# Patient Record
Sex: Female | Born: 1980
Health system: Southern US, Community
[De-identification: ages and names within clinical notes are randomized; demographics above are authoritative.]

## PROBLEM LIST (undated history)

## (undated) DIAGNOSIS — Z808 Family history of malignant neoplasm of other organs or systems: Secondary | ICD-10-CM

## (undated) DIAGNOSIS — B002 Herpesviral gingivostomatitis and pharyngotonsillitis: Secondary | ICD-10-CM

## (undated) DIAGNOSIS — G4730 Sleep apnea, unspecified: Secondary | ICD-10-CM

## (undated) DIAGNOSIS — N200 Calculus of kidney: Secondary | ICD-10-CM

## (undated) DIAGNOSIS — G43909 Migraine, unspecified, not intractable, without status migrainosus: Secondary | ICD-10-CM

## (undated) DIAGNOSIS — F419 Anxiety disorder, unspecified: Secondary | ICD-10-CM

## (undated) DIAGNOSIS — Z803 Family history of malignant neoplasm of breast: Secondary | ICD-10-CM

## (undated) DIAGNOSIS — N12 Tubulo-interstitial nephritis, not specified as acute or chronic: Secondary | ICD-10-CM

## (undated) HISTORY — DX: Calculus of kidney: N20.0

## (undated) HISTORY — DX: Migraine, unspecified, not intractable, without status migrainosus: G43.909

## (undated) HISTORY — PX: NO PAST SURGERIES: SHX2092

## (undated) HISTORY — DX: Family history of malignant neoplasm of breast: Z80.3

## (undated) HISTORY — DX: Herpesviral gingivostomatitis and pharyngotonsillitis: B00.2

## (undated) HISTORY — DX: Sleep apnea, unspecified: G47.30

## (undated) HISTORY — DX: Anxiety disorder, unspecified: F41.9

## (undated) HISTORY — DX: Family history of malignant neoplasm of other organs or systems: Z80.8

## (undated) HISTORY — DX: Tubulo-interstitial nephritis, not specified as acute or chronic: N12

---

## 2010-04-07 ENCOUNTER — Ambulatory Visit: Payer: Self-pay | Admitting: Family Medicine

## 2014-08-01 ENCOUNTER — Other Ambulatory Visit: Payer: Self-pay

## 2014-08-01 DIAGNOSIS — Z3040 Encounter for surveillance of contraceptives, unspecified: Secondary | ICD-10-CM

## 2014-08-01 NOTE — Telephone Encounter (Signed)
Please call patient I am going to deny her request for the oral contraceptive pills She is getting ready to turn 34 years old in a few weeks Let her know that I'll recommend condoms for now and will be glad to refer her to see a gynecologist for discussion of other contraceptive means that they can manage I hope she realizes I am just looking out for her, but I will not recommend continued use of her current pill (if she has left-over packs, I recommend she STOP those because of the risk of stroke) Thank you, Dr. Sherie DonLada

## 2014-08-02 NOTE — Telephone Encounter (Signed)
Left message to call.

## 2014-08-02 NOTE — Telephone Encounter (Signed)
I tried to call patient, but number listed is wrong I called mobile and reached identified voicemail with name but work; left vague message then that I think we did address her issue this spring, sorry if she did not remember that or think it was addressed (aside: that's why I only gave limited refills of OCPs instead of a whole year) I understand she has another plan in place I am only trying to look out for her

## 2014-08-02 NOTE — Telephone Encounter (Signed)
I notified patient. She was a little upset that she got no "notice" that she would be stopped with no plan in place for a new method of birth control other than condoms. She is going to call herself and schedule [facility]. She states she is going to be out of town a lot this month and it's going to be hard to get in to see GYN before she runs out of her BCP. She asked if you would be willing to give her one more pack to cover her until she can see GYN. I told her probably no, but that i'd double check with you.

## 2014-08-30 ENCOUNTER — Encounter: Payer: Self-pay | Admitting: Obstetrics and Gynecology

## 2014-09-21 ENCOUNTER — Other Ambulatory Visit: Payer: Self-pay

## 2014-09-21 NOTE — Telephone Encounter (Signed)
Routing to provider  

## 2014-09-22 MED ORDER — NABUMETONE 750 MG PO TABS: 750.0000 mg | ORAL_TABLET | Freq: Two times a day (BID) | ORAL | Status: DC | PRN

## 2015-02-12 ENCOUNTER — Other Ambulatory Visit: Payer: Self-pay | Admitting: Family Medicine

## 2015-02-12 NOTE — Telephone Encounter (Signed)
rx approved; changed to PRN instead of routine as it came across on the refill request

## 2015-08-20 ENCOUNTER — Encounter: Payer: Self-pay | Admitting: Family Medicine

## 2015-08-20 ENCOUNTER — Ambulatory Visit (INDEPENDENT_AMBULATORY_CARE_PROVIDER_SITE_OTHER): Payer: 59 | Admitting: Family Medicine

## 2015-08-20 VITALS — BP 125/79 | HR 86 | Temp 98.5°F | Wt 155.0 lb

## 2015-08-20 DIAGNOSIS — N12 Tubulo-interstitial nephritis, not specified as acute or chronic: Secondary | ICD-10-CM

## 2015-08-20 DIAGNOSIS — N760 Acute vaginitis: Secondary | ICD-10-CM

## 2015-08-20 DIAGNOSIS — A499 Bacterial infection, unspecified: Secondary | ICD-10-CM | POA: Diagnosis not present

## 2015-08-20 DIAGNOSIS — B9689 Other specified bacterial agents as the cause of diseases classified elsewhere: Secondary | ICD-10-CM

## 2015-08-20 MED ORDER — CIPROFLOXACIN HCL 500 MG PO TABS: 500.0000 mg | ORAL_TABLET | Freq: Two times a day (BID) | ORAL | 0 refills | Status: DC

## 2015-08-20 MED ORDER — METRONIDAZOLE 500 MG PO TABS: 500.0000 mg | ORAL_TABLET | Freq: Three times a day (TID) | ORAL | 0 refills | Status: DC

## 2015-08-20 NOTE — Progress Notes (Signed)
BP 125/79   Pulse 86   Temp 98.5 F (36.9 C)   Wt 155 lb (70.3 kg)   LMP  (LMP Unknown) Comment: Patient has an IUD  SpO2 98%   BMI 26.94 kg/m    Subjective:    Patient ID: Molly Lynch, female    DOB: 1980/08/10, 35 y.o.   MRN: [id number]  HPI: Molly Lynch is a 35 y.o. female  Chief Complaint  Patient presents with  . Urinary Tract Infection    started about 2 weeks ago as a yeast infection, did OTC meds twice.That improved but still having discharge. Starting Friday she started having the frequency, urgency, burning, low back pain, and lower pain   Patient presents with 2 week history of vaginal itching and discharge that seemed to improve with 2 rounds of OTC medications for yeast. She is now having 3 days of urinary frequency, urgency, dysuria, and lbp. Discharge is also recurring the past 2 days.Taking AZO with good symptomatic relief. Possibly had a fever on Saturday, but otherwise hasn't felt fever/chills. N/V one time yesterday. Back pain started today on left side, achy moderate pain.   Relevant past medical, surgical, family and social history reviewed and updated as indicated. Interim medical history since our last visit reviewed. Allergies and medications reviewed and updated.  Review of Systems  Constitutional: Negative.   HENT: Negative.   Respiratory: Negative.   Cardiovascular: Negative.   Gastrointestinal: Positive for nausea and vomiting.  Genitourinary: Positive for dysuria, flank pain, frequency, urgency and vaginal discharge.  Musculoskeletal: Positive for back pain.  Neurological: Negative.   Psychiatric/Behavioral: Negative.     Per HPI unless specifically indicated above     Objective:    BP 125/79   Pulse 86   Temp 98.5 F (36.9 C)   Wt 155 lb (70.3 kg)   LMP  (LMP Unknown) Comment: Patient has an IUD  SpO2 98%   BMI 26.94 kg/m   Wt Readings from Last 3 Encounters:  08/20/15 155 lb (70.3 kg)  04/03/14 158 lb (71.7 kg)      Physical Exam  Constitutional: She is oriented to person, place, and time. She appears well-developed and well-nourished. No distress.  HENT:  Head: Atraumatic.  Eyes: Conjunctivae are normal. No scleral icterus.  Neck: Normal range of motion. Neck supple.  Cardiovascular: Normal rate.   Pulmonary/Chest: Effort normal and breath sounds normal.  Abdominal: Soft. Bowel sounds are normal. She exhibits no distension. There is tenderness (LLQ tender to deep palpation). There is no guarding.  Genitourinary: Vaginal discharge (thin white discharge present ) found.  Musculoskeletal: Normal range of motion. She exhibits tenderness (CVA tenderness on left).  Neurological: She is alert and oriented to person, place, and time.  Skin: Skin is warm and dry.  Psychiatric: She has a normal mood and affect. Her behavior is normal.  Nursing note and vitals reviewed.   No results found for this or any previous visit.    Assessment & Plan:   Problem List Items Addressed This Visit    None    Visit Diagnoses    Pyelonephritis    -  Primary   Relevant Orders   UA/M w/rflx Culture, Routine (STAT)   BV (bacterial vaginosis)       Relevant Medications   metroNIDAZOLE (FLAGYL) 500 MG tablet   Other Relevant Orders   WET PREP FOR TRICH, YEAST, CLUE     Likely early stage pyelonephritis given presence of CVA tenderness and  intermittent N/V. Discussed with pt that if she develops high fever, intractable N/V, or severe worsening left flank pain to go to ER. Will treat with ciprofloxacin for 7 days. Await urine cx.   Flagyl sent for BV shown in wet prep.     Follow up plan: No Follow-up on file.

## 2015-08-20 NOTE — Patient Instructions (Signed)
Follow up if no improvement in the next few days 

## 2015-08-24 LAB — MICROSCOPIC EXAMINATION

## 2015-08-24 LAB — UA/M W/RFLX CULTURE, ROUTINE
Bilirubin, UA: NEGATIVE
GLUCOSE, UA: NEGATIVE
KETONES UA: NEGATIVE
Nitrite, UA: POSITIVE — AB
UUROB: 0.2 mg/dL (ref 0.2–1.0)
pH, UA: 5 (ref 5.0–7.5)

## 2015-08-24 LAB — URINE CULTURE, REFLEX

## 2015-08-24 LAB — WET PREP FOR TRICH, YEAST, CLUE
Clue Cell Exam: POSITIVE — AB
Trichomonas Exam: NEGATIVE
YEAST EXAM: NEGATIVE

## 2015-10-04 ENCOUNTER — Encounter: Payer: Self-pay | Admitting: Family Medicine

## 2015-10-30 ENCOUNTER — Encounter: Payer: Self-pay | Admitting: Family Medicine

## 2015-11-02 ENCOUNTER — Telehealth: Payer: Self-pay | Admitting: Family Medicine

## 2015-11-02 ENCOUNTER — Other Ambulatory Visit: Payer: Self-pay | Admitting: Family Medicine

## 2015-11-02 ENCOUNTER — Other Ambulatory Visit: Payer: Self-pay

## 2015-11-02 ENCOUNTER — Ambulatory Visit
Admission: RE | Admit: 2015-11-02 | Discharge: 2015-11-02 | Disposition: A | Discharge status: Home / Self Care | Payer: 59 | Source: Ambulatory Visit | Attending: Family Medicine | Admitting: Family Medicine

## 2015-11-02 ENCOUNTER — Encounter: Payer: Self-pay | Admitting: Family Medicine

## 2015-11-02 ENCOUNTER — Ambulatory Visit (INDEPENDENT_AMBULATORY_CARE_PROVIDER_SITE_OTHER): Payer: 59 | Admitting: Family Medicine

## 2015-11-02 VITALS — BP 111/75 | HR 69 | Temp 98.5°F | Wt 155.0 lb

## 2015-11-02 DIAGNOSIS — F17209 Nicotine dependence, unspecified, with unspecified nicotine-induced disorders: Secondary | ICD-10-CM

## 2015-11-02 DIAGNOSIS — S6991XA Unspecified injury of right wrist, hand and finger(s), initial encounter: Secondary | ICD-10-CM | POA: Insufficient documentation

## 2015-11-02 DIAGNOSIS — X58XXXA Exposure to other specified factors, initial encounter: Secondary | ICD-10-CM | POA: Diagnosis not present

## 2015-11-02 MED ORDER — BUPROPION HCL ER (XL) 150 MG PO TB24: 150.0000 mg | ORAL_TABLET | Freq: Every day | ORAL | 30 refills | Status: DC

## 2015-11-02 MED ORDER — PREDNISONE 20 MG PO TABS: 40.0000 mg | ORAL_TABLET | Freq: Every day | ORAL | 0 refills | Status: DC

## 2015-11-02 NOTE — Telephone Encounter (Signed)
Please call pt and let her know that her hand x-ray showed no evidence of fracture or dislocation. I will send her a burst of prednisone to help calm things down and she can take tylenol during that, then resume ibuprofen as needed once prednisone has completed. Rest hand as much as possible, can get a support brace if it's helpful. Ice/heat.

## 2015-11-02 NOTE — Telephone Encounter (Signed)
Left message to call.

## 2015-11-02 NOTE — Patient Instructions (Addendum)
Follow up in about a month to discuss wellbutrin

## 2015-11-02 NOTE — Telephone Encounter (Signed)
Patient was notified of results via MyChart message.

## 2015-11-02 NOTE — Progress Notes (Signed)
BP 111/75   Pulse 69   Temp 98.5 F (36.9 C)   Wt 155 lb (70.3 kg)   LMP  (LMP Unknown)   SpO2 100%   BMI 26.94 kg/m    Subjective:    Patient ID: Molly Lynch, female    DOB: 1980-03-22, 35 y.o.   MRN: [id number]  HPI: Molly Lynch is a 35 y.o. female  Chief Complaint  Patient presents with  . Hand Pain    right hand x 2 months. Was running down hall and hit it on a door frame. Some motions make it hurt worse. Can't open a jar or a bottle without sharp pain.  . Nicotine Dependence    she does smoke socially. has taken Wellbutrin in the past and it worked well to help her stop. Would like to try that again/   Right hand injury mid-July. States she smacked her hand very hard on a door knob directly over her middle MCP. Had bruising, swelling, and severe pain for over a week constantly, slowly lessening over time. Now describes a dull intermittent ache in that area that becomes acutely sharp with some motions or use of grip strength. Certain movements like opening jars and water bottles. Taking ibuprofen with some relief of aching pain.  Socially smokes and wanting to quit. Took wellbutrin for about 6 weeks last year and was able to quit smoking with it. Feels like she stopped too soon though and eventually started back up. Did very well on the medicine previously. Had some difficulty sleeping that dissipated once she changed the time of day that she was taking the medication, but otherwise had no issues.    Past Medical History:  Diagnosis Date  . Anxiety   . Kidney stones   . Migraine    Social History   Social History  . Marital status: Married    Spouse name: N/A  . Number of children: N/A  . Years of education: N/A   Occupational History  . Not on file.   Social History Main Topics  . Smoking status: Current Some Day Smoker  . Smokeless tobacco: Never Used  . Alcohol use Yes     Comment: socially  . Drug use: No  . Sexual activity: Not on file    Other Topics Concern  . Not on file   Social History Narrative  . No narrative on file    Relevant past medical, surgical, family and social history reviewed and updated as indicated. Interim medical history since our last visit reviewed. Allergies and medications reviewed and updated.  Review of Systems  Constitutional: Negative.   HENT: Negative.   Respiratory: Negative.   Cardiovascular: Negative.   Gastrointestinal: Negative.   Genitourinary: Negative.   Musculoskeletal: Positive for arthralgias.  Skin: Negative.   Neurological: Negative.   Psychiatric/Behavioral: Negative.     Per HPI unless specifically indicated above     Objective:    BP 111/75   Pulse 69   Temp 98.5 F (36.9 C)   Wt 155 lb (70.3 kg)   LMP  (LMP Unknown)   SpO2 100%   BMI 26.94 kg/m   Wt Readings from Last 3 Encounters:  11/02/15 155 lb (70.3 kg)  08/20/15 155 lb (70.3 kg)  04/03/14 158 lb (71.7 kg)    Physical Exam  Constitutional: She is oriented to person, place, and time. She appears well-developed and well-nourished. No distress.  HENT:  Head: Atraumatic.  Eyes: Conjunctivae are normal. No  scleral icterus.  Neck: Normal range of motion. Neck supple.  Cardiovascular: Normal rate, regular rhythm and normal heart sounds.   Pulmonary/Chest: Effort normal. No respiratory distress.  Musculoskeletal: Normal range of motion.  Moderately point tender over 3rd MCP, some mild edema in this area as well. Strength intact, but pain with grip  Neurological: She is alert and oriented to person, place, and time.  Sensation intact b/l UEs  Skin: Skin is warm and dry.  Psychiatric: She has a normal mood and affect. Her behavior is normal.  Nursing note and vitals reviewed.     Assessment & Plan:   Problem List Items Addressed This Visit    None    Visit Diagnoses    Hand injury, right, initial encounter    -  Primary   Await x-ray, will set follow up depending on the results. Continue  ibuprofen and rest as needed in the meantime.   Relevant Orders   DG Hand Complete Right (Completed)   Tobacco use disorder, continuous       Will try Wellbutrin again as she's had success in the past. Start with 150 mg QD, will discuss increasing in about 2 weeks. Encouraged her to set quit date.        Follow up plan: Return in about 4 weeks (around 11/30/2015) for Medication management.

## 2016-05-17 ENCOUNTER — Encounter: Payer: Self-pay | Admitting: Family Medicine

## 2016-05-19 ENCOUNTER — Other Ambulatory Visit: Payer: Self-pay | Admitting: Family Medicine

## 2016-05-19 MED ORDER — BUPROPION HCL ER (XL) 300 MG PO TB24: 300.0000 mg | ORAL_TABLET | Freq: Every day | ORAL | 6 refills | Status: DC

## 2016-07-21 ENCOUNTER — Ambulatory Visit (INDEPENDENT_AMBULATORY_CARE_PROVIDER_SITE_OTHER): Payer: 59 | Admitting: Family Medicine

## 2016-07-21 ENCOUNTER — Encounter: Payer: Self-pay | Admitting: Family Medicine

## 2016-07-21 VITALS — BP 140/91 | HR 73 | Temp 99.0°F | Ht 63.3 in | Wt 167.0 lb

## 2016-07-21 DIAGNOSIS — T23232A Burn of second degree of multiple left fingers (nail), not including thumb, initial encounter: Secondary | ICD-10-CM | POA: Diagnosis not present

## 2016-07-21 DIAGNOSIS — T23202A Burn of second degree of left hand, unspecified site, initial encounter: Secondary | ICD-10-CM | POA: Diagnosis not present

## 2016-07-21 DIAGNOSIS — Z23 Encounter for immunization: Secondary | ICD-10-CM | POA: Diagnosis not present

## 2016-07-21 MED ORDER — SILVER SULFADIAZINE 1 % EX CREA: 1.0000 "application " | TOPICAL_CREAM | Freq: Two times a day (BID) | CUTANEOUS | 1 refills | Status: DC

## 2016-07-21 NOTE — Patient Instructions (Addendum)
Burn Care, Adult  A burn is an injury to the skin or the tissues under the skin. There are three types of burns:  · First degree. These burns may cause the skin to be red and slightly swollen.  · Second degree. These burns are very painful and cause the skin to be very red. The skin may also leak fluid, look shiny, and develop blisters.  · Third degree. These burns cause permanent damage. They either turn the skin white or black and make it look charred, dry, and leathery.    Taking care of your burn properly can help to prevent pain and infection. It can also help the burn to heal more quickly.  What are the risks?  Complications from burns include:  · Damage to the skin.  · Reduced blood flow near the injury.  · Dead tissue.  · Scarring.  · Problems with movement, if the burn happened near a joint or on the hands or feet.    Severe burns can lead to problems that affect the whole body, such as:  · Fluid loss.  · Less blood circulating in the body.  · Inability to maintain a normal core body temperature (thermoregulation).  · Infection.  · Shock.  · Problems breathing.    How to care for a first-degree burn  Right after a burn:  · Rinse or soak the burn under cool water until the pain stops. Do not put ice on your burn. This can cause more damage.  · Lightly cover the burn with a sterile cloth (dressing).  Burn care  · Follow instructions from your health care provider about:  ? How to clean and take care of the burn.  ? When to change and remove the dressing.  · Check your burn every day for signs of infection. Check for:  ? More redness, swelling, or pain.  ? Warmth.  ? Pus or a bad smell.  Medicine  · Take over-the-counter and prescription medicines only as told by your health care provider.  · If you were prescribed antibiotic medicine, take or apply it as told by your health care provider. Do not stop using the antibiotic even if your condition improves.  General instructions  · To prevent infection, do not  put butter, oil, or other home remedies on your burn.  · Do not rub your burn, even when you are cleaning it.  · Protect your burn from the sun.  How to care for a second-degree burn  Right after a burn:  · Rinse or soak the burn under cool water. Do this for several minutes. Do not put ice on your burn. This can cause more damage.  · Lightly cover the burn with a sterile cloth (dressing).  Burn care  · Raise (elevate) the injured area above the level of your heart while sitting or lying down.  · Follow instructions from your health care provider about:  ? How to clean and take care of the burn.  ? When to change and remove the dressing.  · Check your burn every day for signs of infection. Check for:  ? More redness, swelling, or pain.  ? Warmth.  ? Pus or a bad smell.  Medicine    · Take over-the-counter and prescription medicines only as told by your health care provider.  · If you were prescribed antibiotic medicine, take or apply it as told by your health care provider. Do not stop using the antibiotic even if your   the sun. How to care for a third-degree burn Right after a burn:  Lightly cover the burn with gauze.  Seek immediate medical attention. Burn care  Raise (elevate) the injured area above the level of your heart while sitting or lying down.  Drink enough fluid to keep your urine clear or pale yellow.  Rest as told by your health care provider. Do not participate in sports or other physical activities until your health care provider approves.  Follow instructions from your health care provider about: ? How to clean and take care of the burn. ? When to change and remove the dressing.  Check  your burn every day for signs of infection. Check for: ? More redness, swelling, or pain. ? Warmth. ? Pus or a bad smell. Medicine  Take over-the-counter and prescription medicines only as told by your health care provider.  If you were prescribed antibiotic medicine, take or apply it as told by your health care provider. Do not stop using the antibiotic even if your condition improves. General instructions  To prevent infection: ? Do not put butter, oil, or other home remedies on the burn. ? Do not scratch or pick at the burn. ? Do not break any blisters. ? Do not peel skin. ? Do not rub your burn, even when you are cleaning it.  Protect your burn from the sun.  Keep all follow-up visits as told by your health care provider. This is important. Contact a health care provider if:  Your condition does not improve.  Your condition gets worse.  You have a fever.  Your burn changes in appearance or develops black or red spots.  Your burn feels warm to the touch.  Your pain is not controlled with medicine. Get help right away if:  You have redness, swelling, or pain at the site of the burn.  You have fluid, blood, or pus coming from your burn.  You have red streaks near the burn.  You have severe pain. This information is not intended to replace advice given to you by your health care provider. Make sure you discuss any questions you have with your health care provider. Document Released: 01/13/2005 Document Revised: 08/05/2015 Document Reviewed: 07/03/2015 Elsevier Interactive Patient Education  2018 [employer]. Tdap Vaccine (Tetanus, Diphtheria and Pertussis): What You Need to Know 1. Why get vaccinated? Tetanus, diphtheria and pertussis are very serious diseases. Tdap vaccine can protect [place] from these diseases. And, Tdap vaccine given to pregnant women can protect newborn babies against pertussis. TETANUS (Lockjaw) is rare in the [place] today. It causes painful  muscle tightening and stiffness, usually all over the body.  It can lead to tightening of muscles in the head and neck so you can't open your mouth, swallow, or sometimes even breathe. Tetanus kills about 1 out of 10 people who are infected even after receiving the best medical care.  DIPHTHERIA is also rare in the [place] today. It can cause a thick coating to form in the back of the throat.  It can lead to breathing problems, heart failure, paralysis, and death.  PERTUSSIS (Whooping Cough) causes severe coughing spells, which can cause difficulty breathing, vomiting and disturbed sleep.  It can also lead to weight loss, incontinence, and rib fractures. Up to 2 in 100 adolescents and 5 in 100 adults with pertussis are hospitalized or have complications, which could include pneumonia or death.  These diseases are caused by bacteria. Diphtheria and pertussis are spread from person  to person through secretions from coughing or sneezing. Tetanus enters the body through cuts, scratches, or wounds. Before vaccines, as many as 200,000 cases of diphtheria, 200,000 cases of pertussis, and hundreds of cases of tetanus, were reported in the [place] each year. Since vaccination began, reports of cases for tetanus and diphtheria have dropped by about 99% and for pertussis by about 80%. 2. Tdap vaccine Tdap vaccine can protect adolescents and adults from tetanus, diphtheria, and pertussis. One dose of Tdap is routinely given at age 35 or 77. People who did not get Tdap at that age should get it as soon as possible. Tdap is especially important for healthcare professionals and anyone having close contact with a baby younger than 12 months. Pregnant women should get a dose of Tdap during every pregnancy, to protect the newborn from pertussis. Infants are most at risk for severe, life-threatening complications from pertussis. Another vaccine, called Td, protects against tetanus and diphtheria, but not  pertussis. A Td booster should be given every 10 years. Tdap may be given as one of these boosters if you have never gotten Tdap before. Tdap may also be given after a severe cut or burn to prevent tetanus infection. Your doctor or the person giving you the vaccine can give you more information. Tdap may safely be given at the same time as other vaccines. 3. Some people should not get this vaccine  A person who has ever had a life-threatening allergic reaction after a previous dose of any diphtheria, tetanus or pertussis containing vaccine, OR has a severe allergy to any part of this vaccine, should not get Tdap vaccine. Tell the person giving the vaccine about any severe allergies.  Anyone who had coma or long repeated seizures within 7 days after a childhood dose of DTP or DTaP, or a previous dose of Tdap, should not get Tdap, unless a cause other than the vaccine was found. They can still get Td.  Talk to your doctor if you: ? have seizures or another nervous system problem, ? had severe pain or swelling after any vaccine containing diphtheria, tetanus or pertussis, ? ever had a condition called Guillain-Barr Syndrome (GBS), ? aren't feeling well on the day the shot is scheduled. 4. Risks With any medicine, including vaccines, there is a chance of side effects. These are usually mild and go away on their own. Serious reactions are also possible but are rare. Most people who get Tdap vaccine do not have any problems with it. Mild problems following Tdap: (Did not interfere with activities)  Pain where the shot was given (about 3 in 4 adolescents or 2 in 3 adults)  Redness or swelling where the shot was given (about 1 person in 5)  Mild fever of at least 100.35F (up to about 1 in 25 adolescents or 1 in 100 adults)  Headache (about 3 or 4 people in 10)  Tiredness (about 1 person in 3 or 4)  Nausea, vomiting, diarrhea, stomach ache (up to 1 in 4 adolescents or 1 in 10 adults)  Chills,  sore joints (about 1 person in 10)  Body aches (about 1 person in 3 or 4)  Rash, swollen glands (uncommon)  Moderate problems following Tdap: (Interfered with activities, but did not require medical attention)  Pain where the shot was given (up to 1 in 5 or 6)  Redness or swelling where the shot was given (up to about 1 in 16 adolescents or 1 in 12 adults)  Fever over  102F (about 1 in 100 adolescents or 1 in 250 adults)  Headache (about 1 in 7 adolescents or 1 in 10 adults)  Nausea, vomiting, diarrhea, stomach ache (up to 1 or 3 people in 100)  Swelling of the entire arm where the shot was given (up to about 1 in 500).  Severe problems following Tdap: (Unable to perform usual activities; required medical attention)  Swelling, severe pain, bleeding and redness in the arm where the shot was given (rare).  Problems that could happen after any vaccine:  People sometimes faint after a medical procedure, including vaccination. Sitting or lying down for about 15 minutes can help prevent fainting, and injuries caused by a fall. Tell your doctor if you feel dizzy, or have vision changes or ringing in the ears.  Some people get severe pain in the shoulder and have difficulty moving the arm where a shot was given. This happens very rarely.  Any medication can cause a severe allergic reaction. Such reactions from a vaccine are very rare, estimated at fewer than 1 in a million doses, and would happen within a few minutes to a few hours after the vaccination. As with any medicine, there is a very remote chance of a vaccine causing a serious injury or death. The safety of vaccines is always being monitored. For more information, visit: http://floyd.org/www.cdc.gov/vaccinesafety/ 5. What if there is a serious problem? What should I look for? Look for anything that concerns you, such as signs of a severe allergic reaction, very high fever, or unusual behavior. Signs of a severe allergic reaction can include  hives, swelling of the face and throat, difficulty breathing, a fast heartbeat, dizziness, and weakness. These would usually start a few minutes to a few hours after the vaccination. What should I do?  If you think it is a severe allergic reaction or other emergency that can't wait, call 9-1-1 or get the person to the nearest hospital. Otherwise, call your doctor.  Afterward, the reaction should be reported to the Vaccine Adverse Event Reporting System (VAERS). Your doctor might file this report, or you can do it yourself through the VAERS web site at www.vaers.LAgents.nohhs.gov, or by calling 1-2021526811. ? VAERS does not give medical advice. 6. The National Vaccine Injury Compensation Program The [employer] Vaccine Injury Compensation Program (VICP) is a federal program that was created to compensate people who may have been injured by certain vaccines. Persons who believe they may have been injured by a vaccine can learn about the program and about filing a claim by calling 1-419-278-7288 or visiting the VICP website at SpiritualWord.atwww.hrsa.gov/vaccinecompensation. There is a time limit to file a claim for compensation. 7. How can I learn more?  Ask your doctor. He or she can give you the vaccine package insert or suggest other sources of information.  Call your local or state health department.  Contact the Centers for Disease Control and Prevention (CDC): ? Call (615) 343-69221-(236) 647-6055 (1-800-CDC-INFO) or ? Visit CDC's website at PicCapture.uywww.cdc.gov/vaccines CDC Tdap Vaccine VIS (03/22/13) This information is not intended to replace advice given to you by your health care provider. Make sure you discuss any questions you have with your health care provider. Document Released: 07/15/2011 Document Revised: 10/04/2015 Document Reviewed: 10/04/2015 Elsevier Interactive Patient Education  2017 [employer]
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
## 2016-08-15 ENCOUNTER — Ambulatory Visit: Payer: Self-pay | Admitting: Certified Nurse Midwife

## 2016-09-24 ENCOUNTER — Ambulatory Visit: Payer: Self-pay | Admitting: Certified Nurse Midwife

## 2016-10-24 ENCOUNTER — Ambulatory Visit: Payer: Self-pay | Admitting: Certified Nurse Midwife

## 2016-11-14 ENCOUNTER — Ambulatory Visit (INDEPENDENT_AMBULATORY_CARE_PROVIDER_SITE_OTHER): Payer: 59 | Admitting: Certified Nurse Midwife

## 2016-11-14 ENCOUNTER — Encounter: Payer: Self-pay | Admitting: Certified Nurse Midwife

## 2016-11-14 VITALS — BP 112/70 | HR 77 | Ht 63.0 in | Wt 162.0 lb

## 2016-11-14 DIAGNOSIS — Z803 Family history of malignant neoplasm of breast: Secondary | ICD-10-CM | POA: Insufficient documentation

## 2016-11-14 DIAGNOSIS — Z124 Encounter for screening for malignant neoplasm of cervix: Secondary | ICD-10-CM | POA: Diagnosis not present

## 2016-11-14 DIAGNOSIS — Z975 Presence of (intrauterine) contraceptive device: Secondary | ICD-10-CM

## 2016-11-14 DIAGNOSIS — Z30431 Encounter for routine checking of intrauterine contraceptive device: Secondary | ICD-10-CM | POA: Insufficient documentation

## 2016-11-14 DIAGNOSIS — Z01419 Encounter for gynecological examination (general) (routine) without abnormal findings: Secondary | ICD-10-CM | POA: Diagnosis not present

## 2016-11-14 NOTE — Progress Notes (Signed)
Gynecology Annual Exam  PCP: Gabriel CirriWicker, Cheryl, NP  Chief Complaint:  Chief Complaint  Patient presents with  . Gynecologic Exam    History of Present Illness:This is a 36 year old Caucasian/White female, G1 P1001 who presents for an annual gyn exam. She is having no significant gyn concerns. Her menses are absent on the Mirena IUD. Her migraines have all but vanished since stopping OCPs and having the Mirena inserted 09/22/2014. She does get some cyclical PMS sx.  Her last Pap smear was ?03/26/2012 and was RNIL. No hx of abnormal Pap smears.  The patient's past medical and social history are notable for smoking and common migraines and anxiety. She is currently using e-cigarettes, and hopes to quit that also. She was able to stop cigarette smoking in 2016 after starting Wellbutrin, but eventually resumed smoking when she stopped the Wellbutrin. Her PCP restarted the Wellbutrin last year to help her with smoking cessation.   Since her last visit, she has had pyelonephritis 2017   The patient does perform self breast exams. Her last mammogram was NA.  There is a family history of breast cancer in her maternal grandmother Genetic testing has not been done.   There is no family history of ovarian cancer.   The patient uses e-cigarettes  She reports drinking alcohol. She reports have 5-7 drinks per week.   She denies illegal drug use.  The patient reports exercising occasionally.  The patient denies current symptoms of depression.    Review of Systems: Review of Systems  Constitutional: Negative for chills, fever and weight loss.  HENT: Negative for congestion, sinus pain and sore throat.   Eyes: Negative for blurred vision and pain.  Respiratory: Negative for hemoptysis, shortness of breath and wheezing.   Cardiovascular: Negative for chest pain, palpitations and leg swelling.  Gastrointestinal: Negative for abdominal pain, blood in stool, diarrhea, heartburn, nausea and  vomiting.  Genitourinary: Negative for dysuria, frequency, hematuria and urgency.  Musculoskeletal: Negative for back pain, joint pain and myalgias.  Skin: Negative for itching and rash.  Neurological: Negative for dizziness, tingling and headaches.  Endo/Heme/Allergies: Negative for environmental allergies and polydipsia. Does not bruise/bleed easily.       Negative for hirsutism   Psychiatric/Behavioral: Negative for depression. The patient is not nervous/anxious and does not have insomnia.     Past Medical History:  Past Medical History:  Diagnosis Date  . Anxiety   . Kidney stones   . Migraine   . Oral herpes   . Pyelonephritis     Past Surgical History:  Past Surgical History:  Procedure Laterality Date  . NO PAST SURGERIES      Family History:  Family History  Problem Relation Age of Onset  . Alcohol abuse Mother   . Mental illness Mother   . Cancer Maternal Grandmother 46       breast  . Diabetes Maternal Grandmother   . Heart disease Maternal Grandmother        Multiple MIs and CABG  . Hip fracture Maternal Grandmother   . Hypertension Maternal Grandfather   . Colon polyps Maternal Grandfather [id number]  . Diabetes Paternal Grandmother   . Cancer Maternal Aunt        Thyroid gland    Social History:  Social History   Social History  . Marital status: Married    Spouse name: N/A  . Number of children: N/A  . Years of education: N/A   Occupational History  .  Not on file.   Social History Main Topics  . Smoking status: Current Some Day Smoker    Types: E-cigarettes  . Smokeless tobacco: Never Used  . Alcohol use Yes     Comment: socially  . Drug use: No  . Sexual activity: Yes    Birth control/ protection: IUD   Other Topics Concern  . Not on file   Social History Narrative  . No narrative on file    Allergies:  No Known Allergies  Medications: Current Outpatient Prescriptions:  .  buPROPion (WELLBUTRIN XL) 300 MG 24 hr tablet, Take 1  tablet (300 mg total) by mouth daily., Disp: 30 tablet, Rfl: 6 .  levonorgestrel (MIRENA) 20 MCG/24HR IUD, 1 each by Intrauterine route once., Disp: , Rfl:  .  nabumetone (RELAFEN) 750 MG tablet, Take 1 tablet (750 mg total) by mouth 2 (two) times daily as needed., Disp: 30 tablet, Rfl: 0Physical Exam Vitals: BP 112/70   Pulse 77   Ht 5\' 3"  (1.6 m)   Wt 162 lb (73.5 kg)   LMP  (LMP Unknown)   BMI 28.70 kg/m   General: WF in  NAD HEENT: normocephalic, anicteric Neck: no thyroid enlargement, no palpable nodules, no cervical lymphadenopathy  Pulmonary: No increased work of breathing, CTAB Cardiovascular: RRR, without murmur  Breast: Breast symmetrical, no tenderness, no palpable nodules or masses, no skin or nipple retraction present, no nipple discharge.  No axillary, infraclavicular or supraclavicular lymphadenopathy. Abdomen: Soft, non-tender, non-distended.  Umbilicus without lesions.  No hepatomegaly or masses palpable. No evidence of hernia. Genitourinary:  External: Normal external female genitalia.  Normal urethral meatus, normal Bartholin's and Skene's glands.    Vagina: Normal vaginal mucosa, no evidence of prolapse.    Cervix: Grossly normal in appearance, no bleeding, non-tender, IUD strings visible  Uterus: Anteverted, normal size, shape, and consistency, mobile, and non-tender  Adnexa: No adnexal masses, non-tender  Rectal: deferred  Lymphatic: no evidence of inguinal lymphadenopathy Extremities: no edema, erythema, or tenderness Neurologic: Grossly intact Psychiatric: mood appropriate, affect full     Assessment: 36 y.o. G1P1001 normal gyn exam  Plan:  1) Breast cancer screening - recommend monthly self breast exam. Patient qualifies for MYRISK testing and was given information on the test. She is undecided. Start mammograms at age 22  2) Cervical cancer screening - Pap was done. ASCCP guidelines and rational discussed.  Patient opts for every 3 years screening  interval  3) Contraception - Mirena  4) Routine healthcare maintenance including cholesterol and diabetes screening managed by PCP   5) RTO in 1 year and prn  Farrel Conners, CNM

## 2016-11-17 LAB — IGP, APTIMA HPV
HPV APTIMA: NEGATIVE
PAP Smear Comment: 0

## 2017-01-02 ENCOUNTER — Other Ambulatory Visit: Payer: Self-pay | Admitting: Family Medicine

## 2017-01-02 ENCOUNTER — Telehealth: Payer: Self-pay | Admitting: Family Medicine

## 2017-01-02 MED ORDER — BUPROPION HCL ER (XL) 300 MG PO TB24: 300.0000 mg | ORAL_TABLET | Freq: Every day | ORAL | 1 refills | Status: DC

## 2017-01-02 NOTE — Telephone Encounter (Signed)
[facility] sent request for patients bupropion HCL XL 300 mg tabs  Thanks  Fax [phone]

## 2017-01-02 NOTE — Telephone Encounter (Signed)
Refill sent.

## 2017-09-14 ENCOUNTER — Ambulatory Visit: Payer: 59 | Admitting: Family Medicine

## 2017-09-14 ENCOUNTER — Encounter: Payer: Self-pay | Admitting: Family Medicine

## 2017-09-14 VITALS — BP 125/82 | HR 60 | Temp 98.6°F | Wt 170.1 lb

## 2017-09-14 DIAGNOSIS — Z720 Tobacco use: Secondary | ICD-10-CM

## 2017-09-14 DIAGNOSIS — R21 Rash and other nonspecific skin eruption: Secondary | ICD-10-CM | POA: Diagnosis not present

## 2017-09-14 MED ORDER — NYSTATIN 100000 UNIT/GM EX POWD: Freq: Four times a day (QID) | CUTANEOUS | 3 refills | Status: DC

## 2017-09-14 MED ORDER — BUPROPION HCL ER (XL) 300 MG PO TB24: 300.0000 mg | ORAL_TABLET | Freq: Every day | ORAL | 3 refills | Status: DC

## 2017-09-14 MED ORDER — NYSTATIN 100000 UNIT/GM EX CREA: 1.0000 "application " | TOPICAL_CREAM | Freq: Two times a day (BID) | CUTANEOUS | 3 refills | Status: DC

## 2017-09-14 NOTE — Progress Notes (Signed)
BP 125/82 (BP Location: Left Arm, Patient Position: Sitting, Cuff Size: Normal)   Pulse 60   Temp 98.6 F (37 C)   Wt 170 lb 2 oz (77.2 kg)   SpO2 96%   BMI 30.14 kg/m    Subjective:    Patient ID: Molly Lynch, female    DOB: [birth year], 37 y.o.   MRN: [id number]  HPI: Molly Lynch is a 37 y.o. female  Chief Complaint  Patient presents with  . Rash    down gluteal cleft, patient states that it has been there for 2-3 weeks, she states that it itches and burns and that she has tried OTC meds, she did also try yeast medication which seemed  . Nicotine Dependence    Patient would like to restart Wellbutrin   Almost a month of a rash that itches, burns between buttocks. Husband has similar issue. Tried cortisone cream, antifungal creams, monistat cream with no lasting relief. Nothing new to her regimen/products, no recent travel.   Previously quit smoking on wellbutrin. Stopped a while back, has now fallen off the wagon again and hoping to restart the medication.   Past Medical History:  Diagnosis Date  . Anxiety   . Kidney stones   . Migraine   . Oral herpes   . Pyelonephritis    Social History   Socioeconomic History  . Marital status: Married    Spouse name: Not on file  . Number of children: Not on file  . Years of education: Not on file  . Highest education level: Not on file  Occupational History  . Not on file  Social Needs  . Financial resource strain: Not on file  . Food insecurity:    Worry: Not on file    Inability: Not on file  . Transportation needs:    Medical: Not on file    Non-medical: Not on file  Tobacco Use  . Smoking status: Current Some Day Smoker    Types: E-cigarettes  . Smokeless tobacco: Never Used  Substance and Sexual Activity  . Alcohol use: Yes    Comment: socially  . Drug use: No  . Sexual activity: Yes    Birth control/protection: IUD  Lifestyle  . Physical activity:    Days per week: Not on file    Minutes  per session: Not on file  . Stress: Not on file  Relationships  . Social connections:    Talks on phone: Not on file    Gets together: Not on file    Attends religious service: Not on file    Active member of club or organization: Not on file    Attends meetings of clubs or organizations: Not on file    Relationship status: Not on file  . Intimate partner violence:    Fear of current or ex partner: Not on file    Emotionally abused: Not on file    Physically abused: Not on file    Forced sexual activity: Not on file  Other Topics Concern  . Not on file  Social History Narrative  . Not on file    Relevant past medical, surgical, family and social history reviewed and updated as indicated. Interim medical history since our last visit reviewed. Allergies and medications reviewed and updated.  Review of Systems  Per HPI unless specifically indicated above     Objective:    BP 125/82 (BP Location: Left Arm, Patient Position: Sitting, Cuff Size: Normal)   Pulse 60  Temp 98.6 F (37 C)   Wt 170 lb 2 oz (77.2 kg)   SpO2 96%   BMI 30.14 kg/m   Wt Readings from Last 3 Encounters:  09/14/17 170 lb 2 oz (77.2 kg)  11/14/16 162 lb (73.5 kg)  07/21/16 167 lb (75.8 kg)    Physical Exam  Constitutional: She is oriented to person, place, and time. She appears well-developed and well-nourished. No distress.  HENT:  Head: Atraumatic.  Eyes: Conjunctivae and EOM are normal.  Neck: Normal range of motion. Neck supple.  Cardiovascular: Normal rate and regular rhythm.  Pulmonary/Chest: Effort normal and breath sounds normal.  Musculoskeletal: Normal range of motion.  Neurological: She is alert and oriented to person, place, and time.  Skin: Skin is warm and dry.  Erythematous diffuse rash of superior gluteal cleft extending bilaterally about 3 inches.   Psychiatric: She has a normal mood and affect. Her behavior is normal.  Nursing note and vitals reviewed.   Results for orders  placed or performed in visit on 11/14/16  IGP, Aptima HPV  Result Value Ref Range   DIAGNOSIS: Comment    Specimen adequacy: Comment    Clinician Provided ICD10 Comment    Performed by: Comment    PAP Smear Comment .    Note: Comment    Test Methodology Comment    HPV Aptima Negative Negative      Assessment & Plan:   Problem List Items Addressed This Visit      Other   Tobacco use    Did well previously with cessation on wellbutrin. Will restart and work on quitting again for good       Other Visit Diagnoses    Rash    -  Primary   Appears candidal, will tx with nystatin cream and powder. Keep area clean and dry. F/u if no improvement       Follow up plan: Return for CPE.

## 2017-09-16 DIAGNOSIS — F17290 Nicotine dependence, other tobacco product, uncomplicated: Secondary | ICD-10-CM | POA: Insufficient documentation

## 2017-09-16 DIAGNOSIS — F17210 Nicotine dependence, cigarettes, uncomplicated: Secondary | ICD-10-CM | POA: Insufficient documentation

## 2017-09-16 DIAGNOSIS — Z720 Tobacco use: Secondary | ICD-10-CM | POA: Insufficient documentation

## 2017-09-16 NOTE — Patient Instructions (Signed)
Follow up for CPE 

## 2017-09-16 NOTE — Assessment & Plan Note (Signed)
Did well previously with cessation on wellbutrin. Will restart and work on quitting again for good

## 2017-12-12 DIAGNOSIS — J029 Acute pharyngitis, unspecified: Secondary | ICD-10-CM | POA: Diagnosis not present

## 2017-12-18 DIAGNOSIS — J0100 Acute maxillary sinusitis, unspecified: Secondary | ICD-10-CM | POA: Diagnosis not present

## 2017-12-30 ENCOUNTER — Ambulatory Visit (INDEPENDENT_AMBULATORY_CARE_PROVIDER_SITE_OTHER): Payer: 59 | Admitting: Family Medicine

## 2017-12-30 VITALS — BP 124/89 | HR 68 | Temp 97.9°F | Wt 168.0 lb

## 2017-12-30 DIAGNOSIS — Z720 Tobacco use: Secondary | ICD-10-CM

## 2017-12-30 DIAGNOSIS — N760 Acute vaginitis: Secondary | ICD-10-CM

## 2017-12-30 DIAGNOSIS — F14220 Cocaine dependence with intoxication, uncomplicated: Secondary | ICD-10-CM | POA: Diagnosis not present

## 2017-12-30 DIAGNOSIS — R059 Cough, unspecified: Secondary | ICD-10-CM

## 2017-12-30 DIAGNOSIS — Z0000 Encounter for general adult medical examination without abnormal findings: Secondary | ICD-10-CM | POA: Diagnosis not present

## 2017-12-30 DIAGNOSIS — B9689 Other specified bacterial agents as the cause of diseases classified elsewhere: Secondary | ICD-10-CM

## 2017-12-30 DIAGNOSIS — R05 Cough: Secondary | ICD-10-CM

## 2017-12-30 DIAGNOSIS — N898 Other specified noninflammatory disorders of vagina: Secondary | ICD-10-CM | POA: Diagnosis not present

## 2017-12-30 LAB — UA/M W/RFLX CULTURE, ROUTINE
Bilirubin, UA: NEGATIVE
GLUCOSE, UA: NEGATIVE
KETONES UA: NEGATIVE
Leukocytes, UA: NEGATIVE
Nitrite, UA: NEGATIVE
Protein, UA: NEGATIVE
Specific Gravity, UA: 1.01 (ref 1.005–1.030)
Urobilinogen, Ur: 0.2 mg/dL (ref 0.2–1.0)
pH, UA: 6 (ref 5.0–7.5)

## 2017-12-30 LAB — WET PREP FOR TRICH, YEAST, CLUE
Clue Cell Exam: POSITIVE — AB
Trichomonas Exam: NEGATIVE
YEAST EXAM: NEGATIVE

## 2017-12-30 LAB — MICROSCOPIC EXAMINATION
Bacteria, UA: NONE SEEN
WBC, UA: NONE SEEN /hpf (ref 0–5)

## 2017-12-30 MED ORDER — HYDROCOD POLST-CPM POLST ER 10-8 MG/5ML PO SUER: 5.0000 mL | Freq: Every evening | ORAL | 0 refills | Status: DC | PRN

## 2017-12-30 MED ORDER — BUDESONIDE-FORMOTEROL FUMARATE 160-4.5 MCG/ACT IN AERO: 2.0000 | INHALATION_SPRAY | Freq: Two times a day (BID) | RESPIRATORY_TRACT | 0 refills | Status: DC

## 2017-12-30 MED ORDER — METRONIDAZOLE 500 MG PO TABS: 500.0000 mg | ORAL_TABLET | Freq: Two times a day (BID) | ORAL | 0 refills | Status: DC

## 2017-12-30 NOTE — Progress Notes (Signed)
BP 124/89   Pulse 68   Temp 97.9 F (36.6 C) (Oral)   Wt 168 lb (76.2 kg)   SpO2 97%   BMI 29.76 kg/m    Subjective:    Patient ID: Molly Lynch, female    DOB: Oct 18, 1980, 37 y.o.   MRN: [id number]  HPI: Molly Lynch is a 37 y.o. female presenting on 12/30/2017 for comprehensive medical examination. Current medical complaints include:see below  Still smoking 1-2 cigarettes daily, likes the wellbutrin but does feel like it affects her sex drive. Not currently bothering her enough to want to come off. Finds the only time she's wanting to smoke is after dinner these days.   Cough for about 3 weeks now, was productive but now just hacking cough at night. Tessalon not helping. Has been taking atrovent nasal spray for post nasal drainage which seems to be helping. Denies fevers, chills, CP, SOB.   Still having discharge, hx of BV and feels she probably still has it. Denies abdominal pain, fevers, chills, dysuria, N/V/D.   She currently lives with: husband Menopausal Symptoms: no  Depression Screen done today and results listed below:  [facility]  Decreased Interest 0  Down, Depressed, Hopeless 0  PHQ - 2 Score 0  Altered sleeping 0  Tired, decreased energy 1  Change in appetite 0  Feeling bad or failure about yourself  0  Trouble concentrating 0  Moving slowly or fidgety/restless 0  Suicidal thoughts 0  PHQ-9 Score 1  Difficult doing work/chores Not difficult at all    The patient does not have a history of falls. I did not complete a risk assessment for falls. A plan of care for falls was not documented.   Past Medical History:  Past Medical History:  Diagnosis Date  . Anxiety   . Kidney stones   . Migraine   . Oral herpes   . Pyelonephritis     Surgical History:  Past Surgical History:  Procedure Laterality Date  . NO PAST SURGERIES      Medications:  Current Outpatient Medications on File Prior to Visit  Medication Sig    . buPROPion (WELLBUTRIN XL) 300 MG 24 hr tablet Take 1 tablet (300 mg total) by mouth daily.  Marland Kitchen. ipratropium (ATROVENT) 0.06 % nasal spray Place 2 sprays into both nostrils 4 (four) times daily.  Marland Kitchen. levonorgestrel (MIRENA) 20 MCG/24HR IUD 1 each by Intrauterine route once.  . nystatin (NYSTATIN) powder Apply topically 4 (four) times daily.  Marland Kitchen. nystatin cream (MYCOSTATIN) Apply 1 application topically 2 (two) times daily.  . nabumetone (RELAFEN) 750 MG tablet Take 1 tablet (750 mg total) by mouth 2 (two) times daily as needed. (Patient not taking: Reported on 12/30/2017)   No current facility-administered medications on file prior to visit.     Allergies:  No Known Allergies  Social History:  Social History   Socioeconomic History  . Marital status: Married    Spouse name: Not on file  . Number of children: Not on file  . Years of education: Not on file  . Highest education level: Not on file  Occupational History  . Not on file  Social Needs  . Financial resource strain: Not on file  . Food insecurity:    Worry: Not on file    Inability: Not on file  . Transportation needs:    Medical: Not on file    Non-medical: Not on file  Tobacco Use  . Smoking  status: Current Some Day Smoker    Types: E-cigarettes  . Smokeless tobacco: Never Used  Substance and Sexual Activity  . Alcohol use: Yes    Comment: socially  . Drug use: No  . Sexual activity: Yes    Birth control/protection: IUD  Lifestyle  . Physical activity:    Days per week: Not on file    Minutes per session: Not on file  . Stress: Not on file  Relationships  . Social connections:    Talks on phone: Not on file    Gets together: Not on file    Attends religious service: Not on file    Active member of club or organization: Not on file    Attends meetings of clubs or organizations: Not on file    Relationship status: Not on file  . Intimate partner violence:    Fear of current or ex partner: Not on file     Emotionally abused: Not on file    Physically abused: Not on file    Forced sexual activity: Not on file  Other Topics Concern  . Not on file  Social History Narrative  . Not on file   Social History   Tobacco Use  Smoking Status Current Some Day Smoker  . Types: E-cigarettes  Smokeless Tobacco Never Used   Social History   Substance and Sexual Activity  Alcohol Use Yes   Comment: socially    Family History:  Family History  Problem Relation Age of Onset  . Alcohol abuse Mother   . Mental illness Mother   . Cancer Maternal Grandmother 46       breast  . Diabetes Maternal Grandmother   . Heart disease Maternal Grandmother        Multiple MIs and CABG  . Hip fracture Maternal Grandmother   . Hypertension Maternal Grandfather   . Colon polyps Maternal Grandfather 79  . Diabetes Paternal Grandmother   . Cancer Maternal Aunt        Thyroid gland    Past medical history, surgical history, medications, allergies, family history and social history reviewed with patient today and changes made to appropriate areas of the chart.   Review of Systems - General ROS: negative Psychological ROS: negative Ophthalmic ROS: negative ENT ROS: positive for - nasal discharge Allergy and Immunology ROS: positive for - postnasal drip Hematological and Lymphatic ROS: negative Endocrine ROS: negative Breast ROS: negative for breast lumps Respiratory ROS: positive for - cough Cardiovascular ROS: no chest pain or dyspnea on exertion Gastrointestinal ROS: no abdominal pain, change in bowel habits, or black or bloody stools Genito-Urinary ROS: positive for - genital discharge Musculoskeletal ROS: negative Neurological ROS: no TIA or stroke symptoms Dermatological ROS: negative All other ROS negative except what is listed above and in the HPI.      Objective:    BP 124/89   Pulse 68   Temp 97.9 F (36.6 C) (Oral)   Wt 168 lb (76.2 kg)   SpO2 97%   BMI 29.76 kg/m   Wt Readings  from Last 3 Encounters:  12/30/17 168 lb (76.2 kg)  09/14/17 170 lb 2 oz (77.2 kg)  11/14/16 162 lb (73.5 kg)    Physical Exam  Constitutional: She is oriented to person, place, and time. She appears well-developed and well-nourished. No distress.  HENT:  Head: Atraumatic.  Right Ear: External ear normal.  Left Ear: External ear normal.  Nose: Nose normal.  Mouth/Throat: Oropharynx is clear and moist. No  oropharyngeal exudate.  Eyes: Pupils are equal, round, and reactive to light. Conjunctivae are normal. No scleral icterus.  Neck: Normal range of motion. Neck supple. No thyromegaly present.  Cardiovascular: Normal rate, regular rhythm, normal heart sounds and intact distal pulses.  Pulmonary/Chest: Effort normal and breath sounds normal. No respiratory distress.  Abdominal: Soft. Bowel sounds are normal. She exhibits no mass. There is no tenderness.  Genitourinary: Vaginal discharge found.  Musculoskeletal: Normal range of motion. She exhibits no edema or tenderness.  Lymphadenopathy:    She has no cervical adenopathy.  Neurological: She is alert and oriented to person, place, and time. No cranial nerve deficit.  Skin: Skin is warm and dry. No rash noted.  Psychiatric: She has a normal mood and affect. Her behavior is normal.  Nursing note and vitals reviewed.   Results for orders placed or performed in visit on 12/30/17  WET PREP FOR TRICH, YEAST, CLUE  Result Value Ref Range   Trichomonas Exam Negative Negative   Yeast Exam Negative Negative   Clue Cell Exam Positive (A) Negative  Microscopic Examination  Result Value Ref Range   WBC, UA None seen 0 - 5 /hpf   RBC, UA 0-2 0 - 2 /hpf   Epithelial Cells (non renal) 0-10 0 - 10 /hpf   Bacteria, UA None seen None seen/Few  CBC with Differential/Platelet  Result Value Ref Range   WBC 11.0 (H) 3.4 - 10.8 x10E3/uL   RBC 4.66 3.77 - 5.28 x10E6/uL   Hemoglobin 14.0 11.1 - 15.9 g/dL   Hematocrit 21.3 08.6 - 46.6 %   MCV 92 79 -  97 fL   MCH 30.0 26.6 - 33.0 pg   MCHC 32.7 31.5 - 35.7 g/dL   RDW 57.8 (L) 46.9 - 62.9 %   Platelets 449 150 - 450 x10E3/uL   Neutrophils 64 Not Estab. %   Lymphs 25 Not Estab. %   Monocytes 6 Not Estab. %   Eos 3 Not Estab. %   Basos 1 Not Estab. %   Neutrophils Absolute 7.2 (H) 1.4 - 7.0 x10E3/uL   Lymphocytes Absolute 2.7 0.7 - 3.1 x10E3/uL   Monocytes Absolute 0.7 0.1 - 0.9 x10E3/uL   EOS (ABSOLUTE) 0.3 0.0 - 0.4 x10E3/uL   Basophils Absolute 0.1 0.0 - 0.2 x10E3/uL   Immature Granulocytes 1 Not Estab. %   Immature Grans (Abs) 0.1 0.0 - 0.1 x10E3/uL  Comprehensive metabolic panel  Result Value Ref Range   Glucose 86 65 - 99 mg/dL   BUN 11 6 - 20 mg/dL   Creatinine, Ser 5.28 0.57 - 1.00 mg/dL   GFR calc non Af Amer 85 >59 mL/min/1.73   GFR calc Af Amer 98 >59 mL/min/1.73   BUN/Creatinine Ratio 13 9 - 23   Sodium 138 134 - 144 mmol/L   Potassium 4.5 3.5 - 5.2 mmol/L   Chloride 101 96 - 106 mmol/L   CO2 23 20 - 29 mmol/L   Calcium 9.4 8.7 - 10.2 mg/dL   Total Protein 6.9 6.0 - 8.5 g/dL   Albumin 4.7 3.5 - 5.5 g/dL   Globulin, Total 2.2 1.5 - 4.5 g/dL   Albumin/Globulin Ratio 2.1 1.2 - 2.2   Bilirubin Total 0.3 0.0 - 1.2 mg/dL   Alkaline Phosphatase 80 39 - 117 IU/L   AST 26 0 - 40 IU/L   ALT 25 0 - 32 IU/L  Lipid Panel w/o Chol/HDL Ratio  Result Value Ref Range   Cholesterol, Total 144 100 -  199 mg/dL   Triglycerides 57 0 - 149 mg/dL   HDL 66 >16 mg/dL   VLDL Cholesterol Cal 11 5 - 40 mg/dL   LDL Calculated 67 0 - 99 mg/dL  TSH  Result Value Ref Range   TSH 1.130 0.450 - 4.500 uIU/mL  UA/M w/rflx Culture, Routine  Result Value Ref Range   Specific Gravity, UA 1.010 1.005 - 1.030   pH, UA 6.0 5.0 - 7.5   Color, UA Yellow Yellow   Appearance Ur Clear Clear   Leukocytes, UA Negative Negative   Protein, UA Negative Negative/Trace   Glucose, UA Negative Negative   Ketones, UA Negative Negative   RBC, UA 1+ (A) Negative   Bilirubin, UA Negative Negative    Urobilinogen, Ur 0.2 0.2 - 1.0 mg/dL   Nitrite, UA Negative Negative   Microscopic Examination See below:       Assessment & Plan:   Problem List Items Addressed This Visit      Other   Tobacco use    Counseled for 3 min on habit replacement, routine changes to help with those after dinner cravings and come fully off cigarettes. Continue wellbutrin       Other Visit Diagnoses    BV (bacterial vaginosis)    -  Primary   Tx with flagyl, probiotics. F/u if not improving   Relevant Medications   metroNIDAZOLE (FLAGYL) 500 MG tablet   Other Relevant Orders   WET PREP FOR TRICH, YEAST, CLUE (Completed)   Annual physical exam       Relevant Orders   CBC with Differential/Platelet (Completed)   Comprehensive metabolic panel (Completed)   Lipid Panel w/o Chol/HDL Ratio (Completed)   TSH (Completed)   UA/M w/rflx Culture, Routine (Completed)   Cough       Likely post-infectious inflammation from recent cold. Tussionex given as well as symbicort to reduce inflammation. Use for several weeks and come off when bette       Follow up plan: Return in about 1 year (around 12/31/2018) for CPE.   LABORATORY TESTING:  - Pap smear: up to date  IMMUNIZATIONS:   - Tdap: Tetanus vaccination status reviewed: last tetanus booster within 10 years. - Influenza: Refused  PATIENT COUNSELING:   Advised to take 1 mg of folate supplement per day if capable of pregnancy.   Sexuality: Discussed sexually transmitted diseases, partner selection, use of condoms, avoidance of unintended pregnancy  and contraceptive alternatives.   Advised to avoid cigarette smoking.  I discussed with the patient that most people either abstain from alcohol or drink within safe limits (<=14/week and <=4 drinks/occasion for males, <=7/weeks and <= 3 drinks/occasion for females) and that the risk for alcohol disorders and other health effects rises proportionally with the number of drinks per week and how often a drinker  exceeds daily limits.  Discussed cessation/primary prevention of drug use and availability of treatment for abuse.   Diet: Encouraged to adjust caloric intake to maintain  or achieve ideal body weight, to reduce intake of dietary saturated fat and total fat, to limit sodium intake by avoiding high sodium foods and not adding table salt, and to maintain adequate dietary potassium and calcium preferably from fresh fruits, vegetables, and low-fat dairy products.    stressed the importance of regular exercise  Injury prevention: Discussed safety belts, safety helmets, smoke detector, smoking near bedding or upholstery.   Dental health: Discussed importance of regular tooth brushing, flossing, and dental visits.    NEXT  PREVENTATIVE PHYSICAL DUE IN 1 YEAR. Return in about 1 year (around 12/31/2018) for CPE.

## 2017-12-31 LAB — COMPREHENSIVE METABOLIC PANEL
A/G RATIO: 2.1 (ref 1.2–2.2)
ALBUMIN: 4.7 g/dL (ref 3.5–5.5)
ALT: 25 IU/L (ref 0–32)
AST: 26 IU/L (ref 0–40)
Alkaline Phosphatase: 80 IU/L (ref 39–117)
BILIRUBIN TOTAL: 0.3 mg/dL (ref 0.0–1.2)
BUN/Creatinine Ratio: 13 (ref 9–23)
BUN: 11 mg/dL (ref 6–20)
CHLORIDE: 101 mmol/L (ref 96–106)
CO2: 23 mmol/L (ref 20–29)
Calcium: 9.4 mg/dL (ref 8.7–10.2)
Creatinine, Ser: 0.87 mg/dL (ref 0.57–1.00)
GFR calc non Af Amer: 85 mL/min/{1.73_m2} (ref >59)
GFR, EST AFRICAN AMERICAN: 98 mL/min/{1.73_m2} (ref >59)
Globulin, Total: 2.2 g/dL (ref 1.5–4.5)
Glucose: 86 mg/dL (ref 65–99)
POTASSIUM: 4.5 mmol/L (ref 3.5–5.2)
SODIUM: 138 mmol/L (ref 134–144)
Total Protein: 6.9 g/dL (ref 6.0–8.5)

## 2017-12-31 LAB — CBC WITH DIFFERENTIAL/PLATELET
BASOS: 1 %
Basophils Absolute: 0.1 10*3/uL (ref 0.0–0.2)
EOS (ABSOLUTE): 0.3 10*3/uL (ref 0.0–0.4)
Eos: 3 %
HEMOGLOBIN: 14 g/dL (ref 11.1–15.9)
Hematocrit: 42.8 % (ref 34.0–46.6)
IMMATURE GRANS (ABS): 0.1 10*3/uL (ref 0.0–0.1)
Immature Granulocytes: 1 %
LYMPHS: 25 %
Lymphocytes Absolute: 2.7 10*3/uL (ref 0.7–3.1)
MCH: 30 pg (ref 26.6–33.0)
MCHC: 32.7 g/dL (ref 31.5–35.7)
MCV: 92 fL (ref 79–97)
Monocytes Absolute: 0.7 10*3/uL (ref 0.1–0.9)
Monocytes: 6 %
NEUTROS ABS: 7.2 10*3/uL — AB (ref 1.4–7.0)
Neutrophils: 64 %
PLATELETS: 449 10*3/uL (ref 150–450)
RBC: 4.66 x10E6/uL (ref 3.77–5.28)
RDW: 11.4 % — ABNORMAL LOW (ref 12.3–15.4)
WBC: 11 10*3/uL — ABNORMAL HIGH (ref 3.4–10.8)

## 2017-12-31 LAB — TSH: TSH: 1.13 u[IU]/mL (ref 0.450–4.500)

## 2017-12-31 LAB — LIPID PANEL W/O CHOL/HDL RATIO
Cholesterol, Total: 144 mg/dL (ref 100–199)
HDL: 66 mg/dL (ref >39)
LDL Calculated: 67 mg/dL (ref 0–99)
Triglycerides: 57 mg/dL (ref 0–149)
VLDL Cholesterol Cal: 11 mg/dL (ref 5–40)

## 2018-01-02 NOTE — Assessment & Plan Note (Signed)
Counseled for 3 min on habit replacement, routine changes to help with those after dinner cravings and come fully off cigarettes. Continue wellbutrin

## 2018-01-21 ENCOUNTER — Other Ambulatory Visit: Payer: Self-pay | Admitting: Family Medicine

## 2018-02-02 ENCOUNTER — Encounter: Payer: Self-pay | Admitting: Family Medicine

## 2018-02-02 DIAGNOSIS — H9393 Unspecified disorder of ear, bilateral: Secondary | ICD-10-CM | POA: Diagnosis not present

## 2018-02-17 ENCOUNTER — Encounter: Payer: Self-pay | Admitting: Family Medicine

## 2018-02-17 NOTE — Telephone Encounter (Signed)
Spoke with patient.  Scheduled her an appointment tomorrow with [name] at 1:00PM Patient hasn't been seen since December. Better to go over remedies with [name] and make sure it's not a yeast infection or more going along. Patient verbalized understanding. Routing to provider as [name].

## 2018-02-18 ENCOUNTER — Ambulatory Visit: Payer: 59 | Admitting: Family Medicine

## 2018-02-18 ENCOUNTER — Other Ambulatory Visit: Payer: Self-pay

## 2018-02-18 ENCOUNTER — Encounter: Payer: Self-pay | Admitting: Family Medicine

## 2018-02-18 VITALS — BP 115/82 | HR 71 | Temp 98.0°F | Ht 64.0 in | Wt 171.0 lb

## 2018-02-18 DIAGNOSIS — N760 Acute vaginitis: Secondary | ICD-10-CM

## 2018-02-18 DIAGNOSIS — B9689 Other specified bacterial agents as the cause of diseases classified elsewhere: Secondary | ICD-10-CM | POA: Diagnosis not present

## 2018-02-18 DIAGNOSIS — N898 Other specified noninflammatory disorders of vagina: Secondary | ICD-10-CM | POA: Diagnosis not present

## 2018-02-18 LAB — WET PREP FOR TRICH, YEAST, CLUE
Clue Cell Exam: POSITIVE — AB
Trichomonas Exam: NEGATIVE
Yeast Exam: NEGATIVE

## 2018-02-18 LAB — UA/M W/RFLX CULTURE, ROUTINE
BILIRUBIN UA: NEGATIVE
Glucose, UA: NEGATIVE
KETONES UA: NEGATIVE
LEUKOCYTES UA: NEGATIVE
Nitrite, UA: NEGATIVE
Protein, UA: NEGATIVE
Specific Gravity, UA: 1.01 (ref 1.005–1.030)
Urobilinogen, Ur: 0.2 mg/dL (ref 0.2–1.0)
pH, UA: 7 (ref 5.0–7.5)

## 2018-02-18 LAB — MICROSCOPIC EXAMINATION: RBC, UA: NONE SEEN /hpf (ref 0–2)

## 2018-02-18 MED ORDER — METRONIDAZOLE 0.75 % VA GEL: 1.0000 | Freq: Two times a day (BID) | VAGINAL | 2 refills | Status: DC

## 2018-02-18 NOTE — Progress Notes (Signed)
BP 115/82   Pulse 71   Temp 98 F (36.7 C) (Oral)   Ht 5\' 4"  (1.626 m)   Wt 171 lb (77.6 kg)   SpO2 98%   BMI 29.35 kg/m    Subjective:    Patient ID: Molly Lynch, female    DOB: Nov 25, 1980, 38 y.o.   MRN: [id number]  HPI: Molly Lynch is a 38 y.o. female  Chief Complaint  Patient presents with  . Vaginal Discharge    pt states has had  vaginal itching, burning and odor, off and on for over a month   Here today with recurring vaginal irritation, discharge, and odor for over a month. Treated 1.5 months ago with flagyl which provided temporary relief from sxs. Using unscented soaps, trying to eat more yogurt. Denies fevers, abd pain, N/V/D, dysuria.   Relevant past medical, surgical, family and social history reviewed and updated as indicated. Interim medical history since our last visit reviewed. Allergies and medications reviewed and updated.  Review of Systems  Per HPI unless specifically indicated above     Objective:    BP 115/82   Pulse 71   Temp 98 F (36.7 C) (Oral)   Ht 5\' 4"  (1.626 m)   Wt 171 lb (77.6 kg)   SpO2 98%   BMI 29.35 kg/m   Wt Readings from Last 3 Encounters:  02/18/18 171 lb (77.6 kg)  12/30/17 168 lb (76.2 kg)  09/14/17 170 lb 2 oz (77.2 kg)    Physical Exam Vitals signs and nursing note reviewed.  Constitutional:      Appearance: Normal appearance. She is not ill-appearing.  HENT:     Head: Atraumatic.  Eyes:     Extraocular Movements: Extraocular movements intact.     Conjunctiva/sclera: Conjunctivae normal.  Neck:     Musculoskeletal: Normal range of motion and neck supple.  Cardiovascular:     Rate and Rhythm: Normal rate and regular rhythm.     Heart sounds: Normal heart sounds.  Pulmonary:     Effort: Pulmonary effort is normal.     Breath sounds: Normal breath sounds.  Abdominal:     General: Bowel sounds are normal.     Palpations: Abdomen is soft.     Tenderness: There is no abdominal tenderness. There  is no right CVA tenderness or left CVA tenderness.  Musculoskeletal: Normal range of motion.  Skin:    General: Skin is warm and dry.  Neurological:     Mental Status: She is alert and oriented to person, place, and time.  Psychiatric:        Mood and Affect: Mood normal.        Thought Content: Thought content normal.        Judgment: Judgment normal.     Results for orders placed or performed in visit on 12/30/17  WET PREP FOR TRICH, YEAST, CLUE  Result Value Ref Range   Trichomonas Exam Negative Negative   Yeast Exam Negative Negative   Clue Cell Exam Positive (A) Negative  Microscopic Examination  Result Value Ref Range   WBC, UA None seen 0 - 5 /hpf   RBC, UA 0-2 0 - 2 /hpf   Epithelial Cells (non renal) 0-10 0 - 10 /hpf   Bacteria, UA None seen None seen/Few  CBC with Differential/Platelet  Result Value Ref Range   WBC 11.0 (H) 3.4 - 10.8 x10E3/uL   RBC 4.66 3.77 - 5.28 x10E6/uL   Hemoglobin 14.0 11.1 -  15.9 g/dL   Hematocrit 31.5 17.6 - 46.6 %   MCV 92 79 - 97 fL   MCH 30.0 26.6 - 33.0 pg   MCHC 32.7 31.5 - 35.7 g/dL   RDW 16.0 (L) 73.7 - 10.6 %   Platelets 449 150 - 450 x10E3/uL   Neutrophils 64 Not Estab. %   Lymphs 25 Not Estab. %   Monocytes 6 Not Estab. %   Eos 3 Not Estab. %   Basos 1 Not Estab. %   Neutrophils Absolute 7.2 (H) 1.4 - 7.0 x10E3/uL   Lymphocytes Absolute 2.7 0.7 - 3.1 x10E3/uL   Monocytes Absolute 0.7 0.1 - 0.9 x10E3/uL   EOS (ABSOLUTE) 0.3 0.0 - 0.4 x10E3/uL   Basophils Absolute 0.1 0.0 - 0.2 x10E3/uL   Immature Granulocytes 1 Not Estab. %   Immature Grans (Abs) 0.1 0.0 - 0.1 x10E3/uL  Comprehensive metabolic panel  Result Value Ref Range   Glucose 86 65 - 99 mg/dL   BUN 11 6 - 20 mg/dL   Creatinine, Ser 2.69 0.57 - 1.00 mg/dL   GFR calc non Af Amer 85 >59 mL/min/1.73   GFR calc Af Amer 98 >59 mL/min/1.73   BUN/Creatinine Ratio 13 9 - 23   Sodium 138 134 - 144 mmol/L   Potassium 4.5 3.5 - 5.2 mmol/L   Chloride 101 96 - 106 mmol/L     CO2 23 20 - 29 mmol/L   Calcium 9.4 8.7 - 10.2 mg/dL   Total Protein 6.9 6.0 - 8.5 g/dL   Albumin 4.7 3.5 - 5.5 g/dL   Globulin, Total 2.2 1.5 - 4.5 g/dL   Albumin/Globulin Ratio 2.1 1.2 - 2.2   Bilirubin Total 0.3 0.0 - 1.2 mg/dL   Alkaline Phosphatase 80 39 - 117 IU/L   AST 26 0 - 40 IU/L   ALT 25 0 - 32 IU/L  Lipid Panel w/o Chol/HDL Ratio  Result Value Ref Range   Cholesterol, Total 144 100 - 199 mg/dL   Triglycerides 57 0 - 149 mg/dL   HDL 66 >48 mg/dL   VLDL Cholesterol Cal 11 5 - 40 mg/dL   LDL Calculated 67 0 - 99 mg/dL  TSH  Result Value Ref Range   TSH 1.130 0.450 - 4.500 uIU/mL  UA/M w/rflx Culture, Routine  Result Value Ref Range   Specific Gravity, UA 1.010 1.005 - 1.030   pH, UA 6.0 5.0 - 7.5   Color, UA Yellow Yellow   Appearance Ur Clear Clear   Leukocytes, UA Negative Negative   Protein, UA Negative Negative/Trace   Glucose, UA Negative Negative   Ketones, UA Negative Negative   RBC, UA 1+ (A) Negative   Bilirubin, UA Negative Negative   Urobilinogen, Ur 0.2 0.2 - 1.0 mg/dL   Nitrite, UA Negative Negative   Microscopic Examination See below:       Assessment & Plan:   Problem List Items Addressed This Visit    None    Visit Diagnoses    BV (bacterial vaginosis)    -  Primary   Tx with vaginal metronidazole, probiotics, good vaginal hygiene. Discussed boric acid supplements as well   Relevant Orders   UA/M w/rflx Culture, Routine   WET PREP FOR TRICH, YEAST, CLUE       Follow up plan: Return if symptoms worsen or fail to improve.

## 2018-06-28 ENCOUNTER — Encounter: Payer: Self-pay | Admitting: Family Medicine

## 2018-06-28 ENCOUNTER — Other Ambulatory Visit: Payer: Self-pay

## 2018-06-28 ENCOUNTER — Ambulatory Visit (INDEPENDENT_AMBULATORY_CARE_PROVIDER_SITE_OTHER): Payer: 59 | Admitting: Family Medicine

## 2018-06-28 ENCOUNTER — Telehealth: Payer: Self-pay

## 2018-06-28 ENCOUNTER — Inpatient Hospital Stay: Admission: RE | Admit: 2018-06-28 | Payer: 59 | Source: Ambulatory Visit

## 2018-06-28 VITALS — Temp 97.1°F

## 2018-06-28 DIAGNOSIS — Z20822 Contact with and (suspected) exposure to covid-19: Secondary | ICD-10-CM

## 2018-06-28 DIAGNOSIS — Z20828 Contact with and (suspected) exposure to other viral communicable diseases: Secondary | ICD-10-CM

## 2018-06-28 NOTE — Progress Notes (Signed)
Temp (!) 97.1 F (36.2 C)    Subjective:    Patient ID: Molly Lynch, female    DOB: 1980-12-05, 38 y.o.   MRN: [id number]  HPI: Molly BakerBelinda S Quam is a 38 y.o. female  Chief Complaint  Patient presents with  . COVID exposure    Patient had someone at her work to test positive for covid- patient found out today.     . This visit was completed via telephone due to the restrictions of the COVID-19 pandemic. All issues as above were discussed and addressed but no physical exam was performed. If it was felt that the patient should be evaluated in the office, they were directed there. The patient verbally consented to this visit. Patient was unable to complete an audio/visual visit due to Technical difficulties,Lack of internet. Due to the catastrophic nature of the COVID-19 pandemic, this visit was done through audio contact only. . Location of the patient: home . Location of the provider: home . Those involved with this call:  . Provider: Roosvelt Maserachel Taejon Irani, PA-C . CMA: Tiffany Reel, CMA . Front Desk/Registration: Harriet PhoJoliza Johnson  . Time spent on call: 15 minutes on the phone discussing health concerns. 5 minutes total spent in review of patient's record and preparation of their chart. I verified patient identity using two factors (patient name and date of birth). Patient consents verbally to being seen via telemedicine visit today.   Patient presenting with concern of a COVID 19 positive exposure with a co-worker last week. She is currently asymptomatic except for a mild cough (which she initially attributed to her typical allergy/reactive airway issues). 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 Would like to get tested. Denies fever, chills, SOB, CP, N/V/D. Not taking anything OTC for sxs.   Relevant past medical, surgical, family and social history reviewed and updated as indicated. Interim medical history since our last  visit reviewed. Allergies and medications reviewed and updated.  Review of Systems  Per HPI unless specifically indicated above     Objective:    Temp (!) 97.1 F (36.2 C)   Wt Readings from Last 3 Encounters:  02/18/18 171 lb (77.6 kg)  12/30/17 168 lb (76.2 kg)  09/14/17 170 lb 2 oz (77.2 kg)    Physical Exam  Unable to perform PE due to technical difficulties with video visit technology  Results for orders placed or performed in visit on 02/18/18  WET PREP FOR TRICH, YEAST, CLUE  Result Value Ref Range   Trichomonas Exam Negative Negative   Yeast Exam Negative Negative   Clue Cell Exam Positive (A) Negative  Microscopic Examination  Result Value Ref Range   WBC, UA 0-5 0 - 5 /hpf   RBC, UA None seen 0 - 2 /hpf   Epithelial Cells (non renal) 0-10 0 - 10 /hpf   Bacteria, UA Moderate (A) None seen/Few  UA/M w/rflx Culture, Routine  Result Value Ref Range   Specific Gravity, UA 1.010 1.005 - 1.030   pH, UA 7.0 5.0 - 7.5   Color, UA Yellow Yellow   Appearance Ur Cloudy (A) Clear   Leukocytes, UA Negative Negative   Protein, UA Negative Negative/Trace   Glucose, UA Negative Negative   Ketones, UA Negative Negative   RBC, UA Trace (A) Negative   Bilirubin, UA Negative Negative   Urobilinogen, Ur 0.2 0.2 - 1.0 mg/dL   Nitrite, UA Negative Negative   Microscopic Examination See  below:       Assessment & Plan:   Problem List Items Addressed This Visit    None    Visit Diagnoses    Exposure to Covid-19 Virus    -  Primary   Will refer for COVID 19 testing and provide work from [employer] until results are back. Monitor closely for sxs, return if worsening       Follow up plan: Return if symptoms worsen or fail to improve.

## 2018-06-28 NOTE — Telephone Encounter (Addendum)
Patient called and advised of the testing, appointment scheduled for tomorrow, 06/29/18 at 0900 at [facility], advised of location and to wear a mask, she verbalized understanding.    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 Does have reactive airway disease that flares with allergy seasons and illness. Her insurance provider is [place]

## 2018-06-29 ENCOUNTER — Other Ambulatory Visit: Payer: Self-pay

## 2018-06-29 DIAGNOSIS — Z20822 Contact with and (suspected) exposure to covid-19: Secondary | ICD-10-CM

## 2018-06-30 LAB — NOVEL CORONAVIRUS, NAA: SARS-CoV-2, NAA: NOT DETECTED

## 2018-07-01 ENCOUNTER — Encounter: Payer: Self-pay | Admitting: Family Medicine

## 2018-07-02 ENCOUNTER — Other Ambulatory Visit: Payer: Self-pay | Admitting: Family Medicine

## 2018-07-02 MED ORDER — BUPROPION HCL ER (XL) 300 MG PO TB24: 300.0000 mg | ORAL_TABLET | Freq: Every day | ORAL | 1 refills | Status: DC

## 2018-07-06 ENCOUNTER — Other Ambulatory Visit: Payer: Self-pay | Admitting: Family Medicine

## 2018-07-06 ENCOUNTER — Encounter: Payer: Self-pay | Admitting: Family Medicine

## 2018-07-06 MED ORDER — BUPROPION HCL ER (XL) 300 MG PO TB24: 300.0000 mg | ORAL_TABLET | Freq: Every day | ORAL | 1 refills | Status: DC

## 2018-12-20 ENCOUNTER — Other Ambulatory Visit: Payer: Self-pay | Admitting: Family Medicine

## 2019-01-24 ENCOUNTER — Encounter: Payer: Self-pay | Admitting: Family Medicine

## 2019-01-24 ENCOUNTER — Ambulatory Visit (INDEPENDENT_AMBULATORY_CARE_PROVIDER_SITE_OTHER): Payer: 59 | Admitting: Family Medicine

## 2019-01-24 ENCOUNTER — Other Ambulatory Visit: Payer: Self-pay

## 2019-01-24 VITALS — BP 126/83 | HR 68 | Temp 98.9°F | Ht 62.5 in | Wt 177.0 lb

## 2019-01-24 DIAGNOSIS — Z0000 Encounter for general adult medical examination without abnormal findings: Secondary | ICD-10-CM | POA: Diagnosis not present

## 2019-01-24 DIAGNOSIS — F419 Anxiety disorder, unspecified: Secondary | ICD-10-CM | POA: Diagnosis not present

## 2019-01-24 DIAGNOSIS — Z23 Encounter for immunization: Secondary | ICD-10-CM | POA: Diagnosis not present

## 2019-01-24 LAB — UA/M W/RFLX CULTURE, ROUTINE
Bilirubin, UA: NEGATIVE
Glucose, UA: NEGATIVE
Ketones, UA: NEGATIVE
Leukocytes,UA: NEGATIVE
Nitrite, UA: NEGATIVE
Protein,UA: NEGATIVE
Specific Gravity, UA: 1.015 (ref 1.005–1.030)
Urobilinogen, Ur: 0.2 mg/dL (ref 0.2–1.0)
pH, UA: 7 (ref 5.0–7.5)

## 2019-01-24 LAB — MICROSCOPIC EXAMINATION: WBC, UA: NONE SEEN /hpf (ref 0–5)

## 2019-01-24 MED ORDER — BUPROPION HCL ER (XL) 300 MG PO TB24: 300.0000 mg | ORAL_TABLET | Freq: Every day | ORAL | 1 refills | Status: DC

## 2019-01-24 NOTE — Progress Notes (Signed)
BP 126/83   Pulse 68   Temp 98.9 F (37.2 C) (Oral)   Ht 5' 2.5" (1.588 m)   Wt 177 lb (80.3 kg)   SpO2 98%   BMI 31.86 kg/m    Subjective:    Patient ID: Molly Lynch, female    DOB: 1980-12-21, 38 y.o.   MRN: [id number]  HPI: Molly Lynch is a 38 y.o. female presenting on 01/24/2019 for comprehensive medical examination. Current medical complaints include:see below  Anxiety and moods continue to do well on the wellbutrin. Taking faithfully without side effects. Denies SI/HI, sleep or appetite issues.   She currently lives with: Menopausal Symptoms: no  Depression Screen done today and results listed below:  Depression screen [facility] 2/9 09/14/2017  Decreased Interest 0  Down, Depressed, Hopeless 0  PHQ - 2 Score 0  Altered sleeping 0  Tired, decreased energy 1  Change in appetite 0  Feeling bad or failure about yourself  0  Trouble concentrating 0  Moving slowly or fidgety/restless 0  Suicidal thoughts 0  PHQ-9 Score 1  Difficult doing work/chores Not difficult at all    The patient does not have a history of falls. I did complete a risk assessment for falls. A plan of care for falls was documented.   Past Medical History:  Past Medical History:  Diagnosis Date  . Anxiety   . Kidney stones   . Migraine   . Oral herpes   . Pyelonephritis     Surgical History:  Past Surgical History:  Procedure Laterality Date  . NO PAST SURGERIES      Medications:  Current Outpatient Medications on File Prior to Visit  Medication Sig  . levonorgestrel (MIRENA) 20 MCG/24HR IUD 1 each by Intrauterine route once.  . budesonide-formoterol (SYMBICORT) 160-4.5 MCG/ACT inhaler Inhale 2 puffs into the lungs 2 (two) times daily. (Patient not taking: Reported on 01/24/2019)   No current facility-administered medications on file prior to visit.    Allergies:  No Known Allergies  Social History:  Social History   Socioeconomic History  . Marital status: Married      Spouse name: Not on file  . Number of children: Not on file  . Years of education: Not on file  . Highest education level: Not on file  Occupational History  . Not on file  Tobacco Use  . Smoking status: Current Some Day Smoker    Types: E-cigarettes  . Smokeless tobacco: Never Used  Substance and Sexual Activity  . Alcohol use: Yes    Comment: socially  . Drug use: No  . Sexual activity: Yes    Birth control/protection: I.U.D.  Other Topics Concern  . Not on file  Social History Narrative  . Not on file   Social Determinants of Health   Financial Resource Strain:   . Difficulty of Paying Living Expenses: Not on file  Food Insecurity:   . Worried About Charity fundraiser in the Last Year: Not on file  . Ran Out of Food in the Last Year: Not on file  Transportation Needs:   . Lack of Transportation (Medical): Not on file  . Lack of Transportation (Non-Medical): Not on file  Physical Activity:   . Days of Exercise per Week: Not on file  . Minutes of Exercise per Session: Not on file  Stress:   . Feeling of Stress : Not on file  Social Connections:   . Frequency of Communication with Friends and Family:  Not on file  . Frequency of Social Gatherings with Friends and Family: Not on file  . Attends Religious Services: Not on file  . Active Member of Clubs or Organizations: Not on file  . Attends Banker Meetings: Not on file  . Marital Status: Not on file  Intimate Partner Violence:   . Fear of Current or Ex-Partner: Not on file  . Emotionally Abused: Not on file  . Physically Abused: Not on file  . Sexually Abused: Not on file   Social History   Tobacco Use  Smoking Status Current Some Day Smoker  . Types: E-cigarettes  Smokeless Tobacco Never Used   Social History   Substance and Sexual Activity  Alcohol Use Yes   Comment: socially    Family History:  Family History  Problem Relation Age of Onset  . Alcohol abuse Mother   . Mental  illness Mother   . Cancer Maternal Grandmother 46       breast  . Diabetes Maternal Grandmother   . Heart disease Maternal Grandmother        Multiple MIs and CABG  . Hip fracture Maternal Grandmother   . Hypertension Maternal Grandfather   . Colon polyps Maternal Grandfather 55  . Diabetes Paternal Grandmother   . Cancer Maternal Aunt        Thyroid gland    Past medical history, surgical history, medications, allergies, family history and social history reviewed with patient today and changes made to appropriate areas of the chart.   Review of Systems - General ROS: negative Psychological ROS: negative Ophthalmic ROS: negative ENT ROS: negative Allergy and Immunology ROS: negative Hematological and Lymphatic ROS: negative Endocrine ROS: negative Breast ROS: negative for breast lumps Respiratory ROS: no cough, shortness of breath, or wheezing Cardiovascular ROS: no chest pain or dyspnea on exertion Gastrointestinal ROS: no abdominal pain, change in bowel habits, or black or bloody stools Genito-Urinary ROS: no dysuria, trouble voiding, or hematuria Musculoskeletal ROS: negative Neurological ROS: no TIA or stroke symptoms Dermatological ROS: negative All other ROS negative except what is listed above and in the HPI.      Objective:    BP 126/83   Pulse 68   Temp 98.9 F (37.2 C) (Oral)   Ht 5' 2.5" (1.588 m)   Wt 177 lb (80.3 kg)   SpO2 98%   BMI 31.86 kg/m   Wt Readings from Last 3 Encounters:  01/24/19 177 lb (80.3 kg)  02/18/18 171 lb (77.6 kg)  12/30/17 168 lb (76.2 kg)    Physical Exam Vitals and nursing note reviewed.  Constitutional:      General: She is not in acute distress.    Appearance: She is well-developed.  HENT:     Head: Atraumatic.     Right Ear: External ear normal.     Left Ear: External ear normal.     Nose: Nose normal.     Mouth/Throat:     Pharynx: No oropharyngeal exudate.  Eyes:     General: No scleral icterus.     Conjunctiva/sclera: Conjunctivae normal.     Pupils: Pupils are equal, round, and reactive to light.  Neck:     Thyroid: No thyromegaly.  Cardiovascular:     Rate and Rhythm: Normal rate and regular rhythm.     Heart sounds: Normal heart sounds.  Pulmonary:     Effort: Pulmonary effort is normal. No respiratory distress.     Breath sounds: Normal breath sounds.  Chest:  Breasts:        Right: No mass, skin change or tenderness.        Left: No mass, skin change or tenderness.  Abdominal:     General: Bowel sounds are normal.     Palpations: Abdomen is soft. There is no mass.     Tenderness: There is no abdominal tenderness.  Genitourinary:    Comments: GU exam declined Musculoskeletal:        General: No tenderness. Normal range of motion.     Cervical back: Normal range of motion and neck supple.  Lymphadenopathy:     Cervical: No cervical adenopathy.     Upper Body:     Right upper body: No axillary adenopathy.     Left upper body: No axillary adenopathy.  Skin:    General: Skin is warm and dry.     Findings: No rash.  Neurological:     Mental Status: She is alert and oriented to person, place, and time.     Cranial Nerves: No cranial nerve deficit.  Psychiatric:        Behavior: Behavior normal.     Results for orders placed or performed in visit on 06/29/18  Novel Coronavirus, NAA ([facility])  Result Value Ref Range   SARS-CoV-2, NAA Not Detected Not Detected      Assessment & Plan:   Problem List Items Addressed This Visit      Other   Anxiety - Primary    Stable and under good control, continue current regimen      Relevant Medications   buPROPion (WELLBUTRIN XL) 300 MG 24 hr tablet    Other Visit Diagnoses    Annual physical exam       Relevant Orders   CBC with Differential/Platelet out   Comprehensive metabolic panel   Lipid Panel w/o Chol/HDL Ratio out   TSH   UA/M w/rflx Culture, Routine   Flu vaccine need       Relevant Orders   Flu  Vaccine QUAD 6+ mos PF IM (Fluarix Quad PF) (Completed)       Follow up plan: Return in about 6 months (around 07/25/2019) for mood/anxiety f/u.   LABORATORY TESTING:  - Pap smear: up to date  IMMUNIZATIONS:   - Tdap: Tetanus vaccination status reviewed: last tetanus booster within 10 years. - Influenza: Administered today  PATIENT COUNSELING:   Advised to take 1 mg of folate supplement per day if capable of pregnancy.   Sexuality: Discussed sexually transmitted diseases, partner selection, use of condoms, avoidance of unintended pregnancy  and contraceptive alternatives.   Advised to avoid cigarette smoking.  I discussed with the patient that most people either abstain from alcohol or drink within safe limits (<=14/week and <=4 drinks/occasion for males, <=7/weeks and <= 3 drinks/occasion for females) and that the risk for alcohol disorders and other health effects rises proportionally with the number of drinks per week and how often a drinker exceeds daily limits.  Discussed cessation/primary prevention of drug use and availability of treatment for abuse.   Diet: Encouraged to adjust caloric intake to maintain  or achieve ideal body weight, to reduce intake of dietary saturated fat and total fat, to limit sodium intake by avoiding high sodium foods and not adding table salt, and to maintain adequate dietary potassium and calcium preferably from fresh fruits, vegetables, and low-fat dairy products.    stressed the importance of regular exercise  Injury prevention: Discussed safety belts, safety helmets, smoke detector, smoking  near bedding or upholstery.   Dental health: Discussed importance of regular tooth brushing, flossing, and dental visits.    NEXT PREVENTATIVE PHYSICAL DUE IN 1 YEAR. Return in about 6 months (around 07/25/2019) for mood/anxiety f/u.

## 2019-01-24 NOTE — Assessment & Plan Note (Signed)
Stable and under good control, continue current regimen 

## 2019-01-25 LAB — COMPREHENSIVE METABOLIC PANEL
ALT: 29 IU/L (ref 0–32)
AST: 28 IU/L (ref 0–40)
Albumin/Globulin Ratio: 1.7 (ref 1.2–2.2)
Albumin: 4.5 g/dL (ref 3.8–4.8)
Alkaline Phosphatase: 81 IU/L (ref 39–117)
BUN/Creatinine Ratio: 14 (ref 9–23)
BUN: 10 mg/dL (ref 6–20)
Bilirubin Total: 0.3 mg/dL (ref 0.0–1.2)
CO2: 21 mmol/L (ref 20–29)
Calcium: 8.7 mg/dL (ref 8.7–10.2)
Chloride: 102 mmol/L (ref 96–106)
Creatinine, Ser: 0.71 mg/dL (ref 0.57–1.00)
GFR calc Af Amer: 124 mL/min/{1.73_m2} (ref >59)
GFR calc non Af Amer: 108 mL/min/{1.73_m2} (ref >59)
Globulin, Total: 2.6 g/dL (ref 1.5–4.5)
Glucose: 56 mg/dL — ABNORMAL LOW (ref 65–99)
Potassium: 4 mmol/L (ref 3.5–5.2)
Sodium: 138 mmol/L (ref 134–144)
Total Protein: 7.1 g/dL (ref 6.0–8.5)

## 2019-01-25 LAB — CBC WITH DIFFERENTIAL/PLATELET
Basophils Absolute: 0.1 10*3/uL (ref 0.0–0.2)
Basos: 1 %
EOS (ABSOLUTE): 0.3 10*3/uL (ref 0.0–0.4)
Eos: 3 %
Hematocrit: 44.8 % (ref 34.0–46.6)
Hemoglobin: 14.3 g/dL (ref 11.1–15.9)
Immature Grans (Abs): 0 10*3/uL (ref 0.0–0.1)
Immature Granulocytes: 1 %
Lymphocytes Absolute: 2.7 10*3/uL (ref 0.7–3.1)
Lymphs: 34 %
MCH: 30.1 pg (ref 26.6–33.0)
MCHC: 31.9 g/dL (ref 31.5–35.7)
MCV: 94 fL (ref 79–97)
Monocytes Absolute: 0.7 10*3/uL (ref 0.1–0.9)
Monocytes: 9 %
Neutrophils Absolute: 4.2 10*3/uL (ref 1.4–7.0)
Neutrophils: 52 %
Platelets: 383 10*3/uL (ref 150–450)
RBC: 4.75 x10E6/uL (ref 3.77–5.28)
RDW: 11.4 % — ABNORMAL LOW (ref 11.7–15.4)
WBC: 8 10*3/uL (ref 3.4–10.8)

## 2019-01-25 LAB — LIPID PANEL W/O CHOL/HDL RATIO
Cholesterol, Total: 147 mg/dL (ref 100–199)
HDL: 70 mg/dL (ref >39)
LDL Chol Calc (NIH): 63 mg/dL (ref 0–99)
Triglycerides: 72 mg/dL (ref 0–149)
VLDL Cholesterol Cal: 14 mg/dL (ref 5–40)

## 2019-01-25 LAB — TSH: TSH: 1.73 u[IU]/mL (ref 0.450–4.500)

## 2019-03-28 ENCOUNTER — Encounter: Payer: Self-pay | Admitting: Family Medicine

## 2019-03-30 ENCOUNTER — Encounter: Payer: Self-pay | Admitting: Family Medicine

## 2019-03-30 ENCOUNTER — Ambulatory Visit (INDEPENDENT_AMBULATORY_CARE_PROVIDER_SITE_OTHER): Payer: 59 | Admitting: Family Medicine

## 2019-03-30 VITALS — Ht 62.5 in | Wt 177.0 lb

## 2019-03-30 DIAGNOSIS — F419 Anxiety disorder, unspecified: Secondary | ICD-10-CM | POA: Diagnosis not present

## 2019-03-30 MED ORDER — VENLAFAXINE HCL ER 37.5 MG PO CP24: 37.5000 mg | ORAL_CAPSULE | Freq: Every day | ORAL | 0 refills | Status: DC

## 2019-03-30 MED ORDER — CLONAZEPAM 0.5 MG PO TABS: 0.2500 mg | ORAL_TABLET | Freq: Every day | ORAL | 0 refills | Status: DC | PRN

## 2019-03-30 NOTE — Progress Notes (Signed)
Ht 5' 2.5" (1.588 m)   Wt 177 lb (80.3 kg)   BMI 31.86 kg/m    Subjective:    Patient ID: Molly Lynch, female    DOB: 1980/11/02, 39 y.o.   MRN: [id number]  HPI: Molly Lynch is a 39 y.o. female  Chief Complaint  Patient presents with  . Anxiety    pt states work has been so stressful and requesting medication for anxiety    . This visit was completed via MyChart due to the restrictions of the COVID-19 pandemic. All issues as above were discussed and addressed. Physical exam was done as above through visual confirmation on MyChart. If it was felt that the patient should be evaluated in the office, they were directed there. The patient verbally consented to this visit. . Location of the patient: home . Location of the provider: work . Those involved with this call:  . Provider: Merrie Roof, PA-C . CMA: Lesle Chris, [place] . Front Desk/Registration: Jill Side  . Time spent on call: 15 minutes with patient face to face via video conference. More than 50% of this time was spent in counseling and coordination of care. 5 minutes total spent in review of patient's record and preparation of their chart. I verified patient identity using two factors (patient name and date of birth). Patient consents verbally to being seen via telemedicine visit today.   Patient presenting today with worsened anxiety, significant work stress at the moment which has been negatively impacting her at times. Taking the wellbutrin daily which has always been good for her. Having difficulty more days than not. Was on effexor in the past as a teenager, did okay with that. Paxil caused dissociation and SI as a teen. Denies current SI/HI.   Depression screen [facility] 2/9 03/30/2019 01/24/2019 09/14/2017  Decreased Interest 1 0 0  Down, Depressed, Hopeless 0 0 0  PHQ - 2 Score 1 0 0  Altered sleeping 2 1 0  Tired, decreased energy 0 0 1  Change in appetite 1 1 0  Feeling bad or failure about yourself  0 0 0   Trouble concentrating 2 1 0  Moving slowly or fidgety/restless 0 0 0  Suicidal thoughts 0 0 0  PHQ-9 Score 6 3 1   Difficult doing work/chores Somewhat difficult - Not difficult at all   GAD 7 : Generalized Anxiety Score 03/30/2019 01/24/2019  Nervous, Anxious, on Edge 3 1  Control/stop worrying 1 0  Worry too much - different things 1 0  Trouble relaxing 3 1  Restless 1 0  Easily annoyed or irritable 3 1  Afraid - awful might happen 0 0  Total GAD 7 Score 12 3  Anxiety Difficulty Somewhat difficult Somewhat difficult     Relevant past medical, surgical, family and social history reviewed and updated as indicated. Interim medical history since our last visit reviewed. Allergies and medications reviewed and updated.  Review of Systems  Per HPI unless specifically indicated above     Objective:    Ht 5' 2.5" (1.588 m)   Wt 177 lb (80.3 kg)   BMI 31.86 kg/m   Wt Readings from Last 3 Encounters:  03/30/19 177 lb (80.3 kg)  01/24/19 177 lb (80.3 kg)  02/18/18 171 lb (77.6 kg)    Physical Exam Vitals and nursing note reviewed.  Constitutional:      General: She is not in acute distress.    Appearance: Normal appearance.  HENT:     Head:  Atraumatic.     Right Ear: External ear normal.     Left Ear: External ear normal.     Nose: Nose normal. No congestion.     Mouth/Throat:     Mouth: Mucous membranes are moist.     Pharynx: Oropharynx is clear. No posterior oropharyngeal erythema.  Eyes:     Extraocular Movements: Extraocular movements intact.     Conjunctiva/sclera: Conjunctivae normal.  Cardiovascular:     Comments: Unable to assess via virtual visit Pulmonary:     Effort: Pulmonary effort is normal. No respiratory distress.  Musculoskeletal:        General: Normal range of motion.     Cervical back: Normal range of motion.  Skin:    General: Skin is dry.     Findings: No erythema.  Neurological:     Mental Status: She is alert and oriented to person,  place, and time.  Psychiatric:        Mood and Affect: Mood normal.        Thought Content: Thought content normal.        Judgment: Judgment normal.     Results for orders placed or performed in visit on 01/24/19  Microscopic Examination   URINE  Result Value Ref Range   WBC, UA None seen 0 - 5 /hpf   RBC 3-10 (A) 0 - 2 /hpf   Epithelial Cells (non renal) 0-10 0 - 10 /hpf   Bacteria, UA Few (A) None seen/Few  CBC with Differential/Platelet out  Result Value Ref Range   WBC 8.0 3.4 - 10.8 x10E3/uL   RBC 4.75 3.77 - 5.28 x10E6/uL   Hemoglobin 14.3 11.1 - 15.9 g/dL   Hematocrit 41.3 24.4 - 46.6 %   MCV 94 79 - 97 fL   MCH 30.1 26.6 - 33.0 pg   MCHC 31.9 31.5 - 35.7 g/dL   RDW 01.0 (L) 27.2 - 53.6 %   Platelets 383 150 - 450 x10E3/uL   Neutrophils 52 Not Estab. %   Lymphs 34 Not Estab. %   Monocytes 9 Not Estab. %   Eos 3 Not Estab. %   Basos 1 Not Estab. %   Neutrophils Absolute 4.2 1.4 - 7.0 x10E3/uL   Lymphocytes Absolute 2.7 0.7 - 3.1 x10E3/uL   Monocytes Absolute 0.7 0.1 - 0.9 x10E3/uL   EOS (ABSOLUTE) 0.3 0.0 - 0.4 x10E3/uL   Basophils Absolute 0.1 0.0 - 0.2 x10E3/uL   Immature Granulocytes 1 Not Estab. %   Immature Grans (Abs) 0.0 0.0 - 0.1 x10E3/uL  Comprehensive metabolic panel  Result Value Ref Range   Glucose 56 (L) 65 - 99 mg/dL   BUN 10 6 - 20 mg/dL   Creatinine, Ser 6.44 0.57 - 1.00 mg/dL   GFR calc non Af Amer 108 >59 mL/min/1.73   GFR calc Af Amer 124 >59 mL/min/1.73   BUN/Creatinine Ratio 14 9 - 23   Sodium 138 134 - 144 mmol/L   Potassium 4.0 3.5 - 5.2 mmol/L   Chloride 102 96 - 106 mmol/L   CO2 21 20 - 29 mmol/L   Calcium 8.7 8.7 - 10.2 mg/dL   Total Protein 7.1 6.0 - 8.5 g/dL   Albumin 4.5 3.8 - 4.8 g/dL   Globulin, Total 2.6 1.5 - 4.5 g/dL   Albumin/Globulin Ratio 1.7 1.2 - 2.2   Bilirubin Total 0.3 0.0 - 1.2 mg/dL   Alkaline Phosphatase 81 39 - 117 IU/L   AST 28 0 - 40 IU/L  ALT 29 0 - 32 IU/L  Lipid Panel w/o Chol/HDL Ratio out  Result  Value Ref Range   Cholesterol, Total 147 100 - 199 mg/dL   Triglycerides 72 0 - 149 mg/dL   HDL 70 >74 mg/dL   VLDL Cholesterol Cal 14 5 - 40 mg/dL   LDL Chol Calc (NIH) 63 0 - 99 mg/dL  TSH  Result Value Ref Range   TSH 1.730 0.450 - 4.500 uIU/mL  UA/M w/rflx Culture, Routine   Specimen: Urine   URINE  Result Value Ref Range   Specific Gravity, UA 1.015 1.005 - 1.030   pH, UA 7.0 5.0 - 7.5   Color, UA Yellow Yellow   Appearance Ur Clear Clear   Leukocytes,UA Negative Negative   Protein,UA Negative Negative/Trace   Glucose, UA Negative Negative   Ketones, UA Negative Negative   RBC, UA Trace (A) Negative   Bilirubin, UA Negative Negative   Urobilinogen, Ur 0.2 0.2 - 1.0 mg/dL   Nitrite, UA Negative Negative   Microscopic Examination See below:       Assessment & Plan:   Problem List Items Addressed This Visit      Other   Anxiety - Primary    Will add low dose effexor to wellbutrin regimen, monitor closely for adverse effects. Recheck 1 month      Relevant Medications   venlafaxine XR (EFFEXOR XR) 37.5 MG 24 hr capsule       Follow up plan: Return in about 4 weeks (around 04/27/2019) for Anxiety.

## 2019-04-03 NOTE — Assessment & Plan Note (Signed)
Will add low dose effexor to wellbutrin regimen, monitor closely for adverse effects. Recheck 1 month

## 2019-04-26 ENCOUNTER — Other Ambulatory Visit: Payer: Self-pay | Admitting: Family Medicine

## 2019-04-26 ENCOUNTER — Encounter: Payer: Self-pay | Admitting: Family Medicine

## 2019-04-26 MED ORDER — VENLAFAXINE HCL ER 37.5 MG PO CP24: 37.5000 mg | ORAL_CAPSULE | Freq: Every day | ORAL | 0 refills | Status: DC

## 2019-05-10 ENCOUNTER — Telehealth (INDEPENDENT_AMBULATORY_CARE_PROVIDER_SITE_OTHER): Payer: 59 | Admitting: Family Medicine

## 2019-05-10 ENCOUNTER — Encounter: Payer: Self-pay | Admitting: Family Medicine

## 2019-05-10 DIAGNOSIS — F419 Anxiety disorder, unspecified: Secondary | ICD-10-CM

## 2019-05-10 MED ORDER — VENLAFAXINE HCL ER 75 MG PO CP24: 75.0000 mg | ORAL_CAPSULE | Freq: Every day | ORAL | 0 refills | Status: DC

## 2019-05-10 NOTE — Progress Notes (Signed)
There were no vitals taken for this visit.   Subjective:    Patient ID: Molly Lynch, female    DOB: [birth year], 39 y.o.   MRN: [id number]  HPI: Molly Lynch is a 39 y.o. female  Chief Complaint  Patient presents with  . Anxiety    . This visit was completed via MyChart due to the restrictions of the COVID-19 pandemic. All issues as above were discussed and addressed. Physical exam was done as above through visual confirmation on MyChart. If it was felt that the patient should be evaluated in the office, they were directed there. The patient verbally consented to this visit. . Location of the patient: home . Location of the provider: home . Those involved with this call:  . Provider: Merrie Roof, PA-C . CMA: Lesle Chris, [place] . Front Desk/Registration: Jill Side  . Time spent on call: 15 minutes with patient face to face via video conference. More than 50% of this time was spent in counseling and coordination of care. 5 minutes total spent in review of patient's record and preparation of their chart. I verified patient identity using two factors (patient name and date of birth). Patient consents verbally to being seen via telemedicine visit today.   Presenting today for 1 month anxiety f/u after adding effexor. Feels it's been helping significantly to control her anxiety. Overall well tolerated but is starting to notice a reduced sex drive since taking it.This is mildly bothersome at this point and she feels the risk is less than benefit. Denies SI/HI.   Depression screen [facility] 2/9 05/10/2019 03/30/2019 01/24/2019  Decreased Interest 0 1 0  Down, Depressed, Hopeless 0 0 0  PHQ - 2 Score 0 1 0  Altered sleeping 1 2 1   Tired, decreased energy 0 0 0  Change in appetite 0 1 1  Feeling bad or failure about yourself  0 0 0  Trouble concentrating 1 2 1   Moving slowly or fidgety/restless 0 0 0  Suicidal thoughts 0 0 0  PHQ-9 Score 2 6 3   Difficult doing work/chores Not  difficult at all Somewhat difficult -   GAD 7 : Generalized Anxiety Score 05/10/2019 03/30/2019 01/24/2019  Nervous, Anxious, on Edge 1 3 1   Control/stop worrying 0 1 0  Worry too much - different things 0 1 0  Trouble relaxing 0 3 1  Restless 0 1 0  Easily annoyed or irritable 1 3 1   Afraid - awful might happen 0 0 0  Total GAD 7 Score 2 12 3   Anxiety Difficulty Not difficult at all Somewhat difficult Somewhat difficult     Relevant past medical, surgical, family and social history reviewed and updated as indicated. Interim medical history since our last visit reviewed. Allergies and medications reviewed and updated.  Review of Systems  Per HPI unless specifically indicated above     Objective:    There were no vitals taken for this visit.  Wt Readings from Last 3 Encounters:  03/30/19 177 lb (80.3 kg)  01/24/19 177 lb (80.3 kg)  02/18/18 171 lb (77.6 kg)    Physical Exam Vitals and nursing note reviewed.  Constitutional:      General: She is not in acute distress.    Appearance: Normal appearance.  HENT:     Head: Atraumatic.     Right Ear: External ear normal.     Left Ear: External ear normal.     Nose: Nose normal. No congestion.  Mouth/Throat:     Mouth: Mucous membranes are moist.     Pharynx: Oropharynx is clear. No posterior oropharyngeal erythema.  Eyes:     Extraocular Movements: Extraocular movements intact.     Conjunctiva/sclera: Conjunctivae normal.  Cardiovascular:     Comments: Unable to assess via virtual visit Pulmonary:     Effort: Pulmonary effort is normal. No respiratory distress.  Musculoskeletal:        General: Normal range of motion.     Cervical back: Normal range of motion.  Skin:    General: Skin is dry.     Findings: No erythema.  Neurological:     Mental Status: She is alert and oriented to person, place, and time.  Psychiatric:        Mood and Affect: Mood normal.        Thought Content: Thought content normal.         Judgment: Judgment normal.     Results for orders placed or performed in visit on 01/24/19  Microscopic Examination   URINE  Result Value Ref Range   WBC, UA None seen 0 - 5 /hpf   RBC 3-10 (A) 0 - 2 /hpf   Epithelial Cells (non renal) 0-10 0 - 10 /hpf   Bacteria, UA Few (A) None seen/Few  CBC with Differential/Platelet out  Result Value Ref Range   WBC 8.0 3.4 - 10.8 x10E3/uL   RBC 4.75 3.77 - 5.28 x10E6/uL   Hemoglobin 14.3 11.1 - 15.9 g/dL   Hematocrit 86.7 67.2 - 46.6 %   MCV 94 79 - 97 fL   MCH 30.1 26.6 - 33.0 pg   MCHC 31.9 31.5 - 35.7 g/dL   RDW 09.4 (L) 70.9 - 62.8 %   Platelets 383 150 - 450 x10E3/uL   Neutrophils 52 Not Estab. %   Lymphs 34 Not Estab. %   Monocytes 9 Not Estab. %   Eos 3 Not Estab. %   Basos 1 Not Estab. %   Neutrophils Absolute 4.2 1.4 - 7.0 x10E3/uL   Lymphocytes Absolute 2.7 0.7 - 3.1 x10E3/uL   Monocytes Absolute 0.7 0.1 - 0.9 x10E3/uL   EOS (ABSOLUTE) 0.3 0.0 - 0.4 x10E3/uL   Basophils Absolute 0.1 0.0 - 0.2 x10E3/uL   Immature Granulocytes 1 Not Estab. %   Immature Grans (Abs) 0.0 0.0 - 0.1 x10E3/uL  Comprehensive metabolic panel  Result Value Ref Range   Glucose 56 (L) 65 - 99 mg/dL   BUN 10 6 - 20 mg/dL   Creatinine, Ser 3.66 0.57 - 1.00 mg/dL   GFR calc non Af Amer 108 >59 mL/min/1.73   GFR calc Af Amer 124 >59 mL/min/1.73   BUN/Creatinine Ratio 14 9 - 23   Sodium 138 134 - 144 mmol/L   Potassium 4.0 3.5 - 5.2 mmol/L   Chloride 102 96 - 106 mmol/L   CO2 21 20 - 29 mmol/L   Calcium 8.7 8.7 - 10.2 mg/dL   Total Protein 7.1 6.0 - 8.5 g/dL   Albumin 4.5 3.8 - 4.8 g/dL   Globulin, Total 2.6 1.5 - 4.5 g/dL   Albumin/Globulin Ratio 1.7 1.2 - 2.2   Bilirubin Total 0.3 0.0 - 1.2 mg/dL   Alkaline Phosphatase 81 39 - 117 IU/L   AST 28 0 - 40 IU/L   ALT 29 0 - 32 IU/L  Lipid Panel w/o Chol/HDL Ratio out  Result Value Ref Range   Cholesterol, Total 147 100 - 199 mg/dL   Triglycerides 72  0 - 149 mg/dL   HDL 70 >49 mg/dL   VLDL  Cholesterol Cal 14 5 - 40 mg/dL   LDL Chol Calc (NIH) 63 0 - 99 mg/dL  TSH  Result Value Ref Range   TSH 1.730 0.450 - 4.500 uIU/mL  UA/M w/rflx Culture, Routine   Specimen: Urine   URINE  Result Value Ref Range   Specific Gravity, UA 1.015 1.005 - 1.030   pH, UA 7.0 5.0 - 7.5   Color, UA Yellow Yellow   Appearance Ur Clear Clear   Leukocytes,UA Negative Negative   Protein,UA Negative Negative/Trace   Glucose, UA Negative Negative   Ketones, UA Negative Negative   RBC, UA Trace (A) Negative   Bilirubin, UA Negative Negative   Urobilinogen, Ur 0.2 0.2 - 1.0 mg/dL   Nitrite, UA Negative Negative   Microscopic Examination See below:       Assessment & Plan:   Problem List Items Addressed This Visit      Other   Anxiety - Primary    Pt wishes to try 75 mg effexor dose for a bit better anxiety coverage but will be very mindful of watching for side effects worsening with increase. Mychart if poorly tolerated and will reduce back to lower dose.       Relevant Medications   venlafaxine XR (EFFEXOR XR) 75 MG 24 hr capsule       Follow up plan: Return in about 6 months (around 11/09/2019) for 6 month f/u.

## 2019-05-10 NOTE — Assessment & Plan Note (Signed)
Pt wishes to try 75 mg effexor dose for a bit better anxiety coverage but will be very mindful of watching for side effects worsening with increase. Mychart if poorly tolerated and will reduce back to lower dose.

## 2019-05-16 ENCOUNTER — Telehealth: Payer: Self-pay | Admitting: Family Medicine

## 2019-05-16 NOTE — Telephone Encounter (Signed)
Called to cancel June appt and schedule 6 mo fu

## 2019-05-16 NOTE — Telephone Encounter (Signed)
-----   Message from Particia Nearing, [place] sent at 05/10/2019 11:52 AM EDT ----- 6 month f/u

## 2019-06-07 ENCOUNTER — Encounter: Payer: Self-pay | Admitting: Family Medicine

## 2019-06-10 ENCOUNTER — Other Ambulatory Visit: Payer: Self-pay | Admitting: Family Medicine

## 2019-06-10 MED ORDER — VENLAFAXINE HCL ER 75 MG PO CP24: 75.0000 mg | ORAL_CAPSULE | Freq: Every day | ORAL | 0 refills | Status: DC

## 2019-07-11 ENCOUNTER — Encounter: Payer: Self-pay | Admitting: Family Medicine

## 2019-07-12 ENCOUNTER — Other Ambulatory Visit: Payer: Self-pay | Admitting: Family Medicine

## 2019-07-12 MED ORDER — BUPROPION HCL ER (XL) 300 MG PO TB24: 300.0000 mg | ORAL_TABLET | Freq: Every day | ORAL | 1 refills | Status: DC

## 2019-07-25 ENCOUNTER — Ambulatory Visit: Payer: 59 | Admitting: Family Medicine

## 2019-08-23 ENCOUNTER — Encounter: Payer: Self-pay | Admitting: Family Medicine

## 2019-09-01 ENCOUNTER — Ambulatory Visit: Payer: Self-pay | Admitting: Family Medicine

## 2019-09-08 ENCOUNTER — Encounter: Payer: Self-pay | Admitting: Family Medicine

## 2019-09-08 ENCOUNTER — Telehealth (INDEPENDENT_AMBULATORY_CARE_PROVIDER_SITE_OTHER): Payer: No Typology Code available for payment source | Admitting: Family Medicine

## 2019-09-08 DIAGNOSIS — F419 Anxiety disorder, unspecified: Secondary | ICD-10-CM | POA: Diagnosis not present

## 2019-09-08 MED ORDER — CLONAZEPAM 0.5 MG PO TABS: 0.2500 mg | ORAL_TABLET | Freq: Every day | ORAL | 0 refills | Status: DC | PRN

## 2019-09-08 MED ORDER — VENLAFAXINE HCL ER 75 MG PO CP24: 75.0000 mg | ORAL_CAPSULE | Freq: Every day | ORAL | 1 refills | Status: DC

## 2019-09-08 NOTE — Progress Notes (Signed)
There were no vitals taken for this visit.   Subjective:    Patient ID: Molly Lynch, female    DOB: Sep 12, 1980, 39 y.o.   MRN: [id number]  HPI: Molly Lynch is a 39 y.o. female  Chief Complaint  Patient presents with  . Anxiety    . This visit was completed via MyChart due to the restrictions of the COVID-19 pandemic. All issues as above were discussed and addressed. Physical exam was done as above through visual confirmation on MyChart. If it was felt that the patient should be evaluated in the office, they were directed there. The patient verbally consented to this visit. . Location of the patient: home . Location of the provider: work . Those involved with this call:  . Provider: Roosvelt Maser, PA-C . CMA: Elton Sin, CMA . Front Desk/Registration: Harriet Pho  . Time spent on call: 15 minutes with patient face to face via video conference. More than 50% of this time was spent in counseling and coordination of care. 5 minutes total spent in review of patient's record and preparation of their chart. I verified patient identity using two factors (patient name and date of birth). Patient consents verbally to being seen via telemedicine visit today.   Feels the effexor has been going very well for her. Taking the klonopin only as needed, usually only needing a half tab at a time. Wanting to taper off the wellbutrin now, mostly was taking it for smoking cessation. Still vaping but hopes to quit all the way very soon. Denies SI/HI, significant mood swings, sleep or appetite issues.   Depression screen [facility] 2/9 09/08/2019 05/10/2019 03/30/2019  Decreased Interest 0 0 1  Down, Depressed, Hopeless 0 0 0  PHQ - 2 Score 0 0 1  Altered sleeping 1 1 2   Tired, decreased energy 1 0 0  Change in appetite 1 0 1  Feeling bad or failure about yourself  0 0 0  Trouble concentrating 2 1 2   Moving slowly or fidgety/restless 1 0 0  Suicidal thoughts 0 0 0  PHQ-9 Score 6 2 6   Difficult  doing work/chores Somewhat difficult Not difficult at all Somewhat difficult   GAD 7 : Generalized Anxiety Score 09/08/2019 05/10/2019 03/30/2019 01/24/2019  Nervous, Anxious, on Edge 1 1 3 1   Control/stop worrying 0 0 1 0  Worry too much - different things 0 0 1 0  Trouble relaxing 1 0 3 1  Restless 2 0 1 0  Easily annoyed or irritable 1 1 3 1   Afraid - awful might happen 0 0 0 0  Total GAD 7 Score 5 2 12 3   Anxiety Difficulty Somewhat difficult Not difficult at all Somewhat difficult Somewhat difficult   Relevant past medical, surgical, family and social history reviewed and updated as indicated. Interim medical history since our last visit reviewed. Allergies and medications reviewed and updated.  Review of Systems  Per HPI unless specifically indicated above     Objective:    There were no vitals taken for this visit.  Wt Readings from Last 3 Encounters:  03/30/19 177 lb (80.3 kg)  01/24/19 177 lb (80.3 kg)  02/18/18 171 lb (77.6 kg)    Physical Exam Vitals and nursing note reviewed.  Constitutional:      General: She is not in acute distress.    Appearance: Normal appearance.  HENT:     Head: Atraumatic.     Right Ear: External ear normal.  Left Ear: External ear normal.     Nose: Nose normal. No congestion.     Mouth/Throat:     Mouth: Mucous membranes are moist.     Pharynx: Oropharynx is clear. No posterior oropharyngeal erythema.  Eyes:     Extraocular Movements: Extraocular movements intact.     Conjunctiva/sclera: Conjunctivae normal.  Cardiovascular:     Comments: Unable to assess via virtual visit Pulmonary:     Effort: Pulmonary effort is normal. No respiratory distress.  Musculoskeletal:        General: Normal range of motion.     Cervical back: Normal range of motion.  Skin:    General: Skin is dry.     Findings: No erythema.  Neurological:     Mental Status: She is alert and oriented to person, place, and time.  Psychiatric:        Mood and  Affect: Mood normal.        Thought Content: Thought content normal.        Judgment: Judgment normal.     Results for orders placed or performed in visit on 01/24/19  Microscopic Examination   URINE  Result Value Ref Range   WBC, UA None seen 0 - 5 /hpf   RBC 3-10 (A) 0 - 2 /hpf   Epithelial Cells (non renal) 0-10 0 - 10 /hpf   Bacteria, UA Few (A) None seen/Few  CBC with Differential/Platelet out  Result Value Ref Range   WBC 8.0 3.4 - 10.8 x10E3/uL   RBC 4.75 3.77 - 5.28 x10E6/uL   Hemoglobin 14.3 11.1 - 15.9 g/dL   Hematocrit 10.1 75.1 - 46.6 %   MCV 94 79 - 97 fL   MCH 30.1 26.6 - 33.0 pg   MCHC 31.9 31 - 35 g/dL   RDW 02.5 (L) 85.2 - 77.8 %   Platelets 383 150 - 450 x10E3/uL   Neutrophils 52 Not Estab. %   Lymphs 34 Not Estab. %   Monocytes 9 Not Estab. %   Eos 3 Not Estab. %   Basos 1 Not Estab. %   Neutrophils Absolute 4.2 1 - 7 x10E3/uL   Lymphocytes Absolute 2.7 0 - 3 x10E3/uL   Monocytes Absolute 0.7 0 - 0 x10E3/uL   EOS (ABSOLUTE) 0.3 0.0 - 0.4 x10E3/uL   Basophils Absolute 0.1 0 - 0 x10E3/uL   Immature Granulocytes 1 Not Estab. %   Immature Grans (Abs) 0.0 0.0 - 0.1 x10E3/uL  Comprehensive metabolic panel  Result Value Ref Range   Glucose 56 (L) 65 - 99 mg/dL   BUN 10 6 - 20 mg/dL   Creatinine, Ser 2.42 0.57 - 1.00 mg/dL   GFR calc non Af Amer 108 >59 mL/min/1.73   GFR calc Af Amer 124 >59 mL/min/1.73   BUN/Creatinine Ratio 14 9 - 23   Sodium 138 134 - 144 mmol/L   Potassium 4.0 3.5 - 5.2 mmol/L   Chloride 102 96 - 106 mmol/L   CO2 21 20 - 29 mmol/L   Calcium 8.7 8.7 - 10.2 mg/dL   Total Protein 7.1 6.0 - 8.5 g/dL   Albumin 4.5 3.8 - 4.8 g/dL   Globulin, Total 2.6 1.5 - 4.5 g/dL   Albumin/Globulin Ratio 1.7 1.2 - 2.2   Bilirubin Total 0.3 0.0 - 1.2 mg/dL   Alkaline Phosphatase 81 39 - 117 IU/L   AST 28 0 - 40 IU/L   ALT 29 0 - 32 IU/L  Lipid Panel w/o Chol/HDL Ratio out  Result Value Ref Range   Cholesterol, Total 147 100 - 199 mg/dL    Triglycerides 72 0 - 149 mg/dL   HDL 70 >80 mg/dL   VLDL Cholesterol Cal 14 5 - 40 mg/dL   LDL Chol Calc (NIH) 63 0 - 99 mg/dL  TSH  Result Value Ref Range   TSH 1.730 0.450 - 4.500 uIU/mL  UA/M w/rflx Culture, Routine   Specimen: Urine   URINE  Result Value Ref Range   Specific Gravity, UA 1.015 1.005 - 1.030   pH, UA 7.0 5.0 - 7.5   Color, UA Yellow Yellow   Appearance Ur Clear Clear   Leukocytes,UA Negative Negative   Protein,UA Negative Negative/Trace   Glucose, UA Negative Negative   Ketones, UA Negative Negative   RBC, UA Trace (A) Negative   Bilirubin, UA Negative Negative   Urobilinogen, Ur 0.2 0.2 - 1.0 mg/dL   Nitrite, UA Negative Negative   Microscopic Examination See below:       Assessment & Plan:   Problem List Items Addressed This Visit      Other   Anxiety - Primary    Stable and under good control, discussed taper schedule on the wellbutrin. Continue effexor and prn klonopin regimen      Relevant Medications   venlafaxine XR (EFFEXOR XR) 75 MG 24 hr capsule       Follow up plan: Return in about 6 months (around 03/10/2020) for Mood f/u.

## 2019-09-09 ENCOUNTER — Encounter: Payer: Self-pay | Admitting: Family Medicine

## 2019-09-14 ENCOUNTER — Telehealth: Payer: Self-pay | Admitting: Family Medicine

## 2019-09-14 NOTE — Telephone Encounter (Signed)
-----   Message from Particia Nearing, [place] sent at 09/14/2019  6:40 AM EDT ----- 6 month mood f/u

## 2019-09-14 NOTE — Assessment & Plan Note (Signed)
Stable and under good control, discussed taper schedule on the wellbutrin. Continue effexor and prn klonopin regimen

## 2019-09-26 ENCOUNTER — Other Ambulatory Visit: Payer: Self-pay | Admitting: Nurse Practitioner

## 2019-09-26 MED ORDER — IPRATROPIUM BROMIDE 0.06 % NA SOLN: 2.0000 | Freq: Four times a day (QID) | NASAL | 3 refills | Status: DC

## 2019-11-24 ENCOUNTER — Ambulatory Visit: Payer: Self-pay | Admitting: Unknown Physician Specialty

## 2019-11-24 ENCOUNTER — Ambulatory Visit: Payer: Self-pay | Admitting: Family Medicine

## 2019-12-01 ENCOUNTER — Ambulatory Visit: Payer: Self-pay | Admitting: Unknown Physician Specialty

## 2020-01-21 ENCOUNTER — Encounter: Payer: Self-pay | Admitting: Nurse Practitioner

## 2020-01-23 ENCOUNTER — Encounter: Payer: Self-pay | Admitting: Nurse Practitioner

## 2020-01-23 ENCOUNTER — Other Ambulatory Visit: Payer: Self-pay

## 2020-01-23 ENCOUNTER — Telehealth: Payer: Self-pay

## 2020-01-23 ENCOUNTER — Ambulatory Visit: Payer: No Typology Code available for payment source | Admitting: Nurse Practitioner

## 2020-01-23 VITALS — BP 122/86 | HR 75 | Temp 98.2°F | Wt 185.2 lb

## 2020-01-23 DIAGNOSIS — F17210 Nicotine dependence, cigarettes, uncomplicated: Secondary | ICD-10-CM

## 2020-01-23 DIAGNOSIS — F419 Anxiety disorder, unspecified: Secondary | ICD-10-CM

## 2020-01-23 DIAGNOSIS — Z23 Encounter for immunization: Secondary | ICD-10-CM | POA: Diagnosis not present

## 2020-01-23 DIAGNOSIS — E6609 Other obesity due to excess calories: Secondary | ICD-10-CM

## 2020-01-23 DIAGNOSIS — L603 Nail dystrophy: Secondary | ICD-10-CM | POA: Diagnosis not present

## 2020-01-23 DIAGNOSIS — R635 Abnormal weight gain: Secondary | ICD-10-CM | POA: Insufficient documentation

## 2020-01-23 DIAGNOSIS — Z6833 Body mass index (BMI) 33.0-33.9, adult: Secondary | ICD-10-CM

## 2020-01-23 DIAGNOSIS — E669 Obesity, unspecified: Secondary | ICD-10-CM | POA: Insufficient documentation

## 2020-01-23 DIAGNOSIS — Z803 Family history of malignant neoplasm of breast: Secondary | ICD-10-CM | POA: Diagnosis not present

## 2020-01-23 MED ORDER — VENLAFAXINE HCL ER 75 MG PO CP24: 75.0000 mg | ORAL_CAPSULE | Freq: Every day | ORAL | 4 refills | Status: DC

## 2020-01-23 NOTE — Progress Notes (Signed)
BP 122/86   Pulse 75   Temp 98.2 F (36.8 C)   Wt 185 lb 4 oz (84 kg)   SpO2 98%   BMI 33.34 kg/m    Subjective:    Patient ID: Molly Lynch, female    DOB: [birth year], 39 y.o.   MRN: [id number]  HPI: Molly Lynch is a 39 y.o. female  Chief Complaint  Patient presents with  . Weight Gain  . Depression  . Anxiety  . other    Hot, brittle nails and irritable    DEPRESSION Continues on Effexor XR 75 MG daily and Klonopin 0.25 MG as needed with last filled on PDMP review on 09/08/19 for #30, previous prior to this on 03/30/19 for #15.  She reports rarely needing her Klonopin -- has not used much since June.  Does not need refill today.  Pt is aware of risks of benzomedication use to include increased sedation, respiratory suppression, falls, dependence and cardiovascular events.  Pt would like to continue treatment as benefit determined to outweigh risk.  She uses appropriately. Mood status: stable Satisfied with current treatment?: yes Symptom severity: mild  Duration of current treatment : chronic Side effects: no Medication compliance: good compliance Psychotherapy/counseling: in past Previous psychiatric medications: Wellbutrin -- used for smoking cessation -- she quit smoking 1 year ago, now vapes only Depressed mood: no Anxious mood: no Anhedonia: no Significant weight loss or gain: yes Insomnia: sometimes, hard to fall asleep Fatigue: yes Feelings of worthlessness or guilt: no Impaired concentration/indecisiveness: no Suicidal ideations: no Hopelessness: no Crying spells: no Depression screen [facility] 2/9 01/23/2020 09/08/2019 05/10/2019 03/30/2019 01/24/2019  Decreased Interest 1 0 0 1 0  Down, Depressed, Hopeless 0 0 0 0 0  PHQ - 2 Score 1 0 0 1 0  Altered sleeping 1 1 1 2 1   Tired, decreased energy 1 1 0 0 0  Change in appetite 1 1 0 1 1  Feeling bad or failure about yourself  1 0 0 0 0  Trouble concentrating 1 2 1 2 1   Moving slowly or fidgety/restless  1 1 0 0 0  Suicidal thoughts 0 0 0 0 0  PHQ-9 Score 7 6 2 6 3   Difficult doing work/chores Somewhat difficult Somewhat difficult Not difficult at all Somewhat difficult -   WEIGHT GAIN AND BRITTLE NAILS: Was doing yoga 4-5 days a week and then studio closed, but has not worked out consistently for 6 months.  Reports never being a super active person, but was able to maintain around 160 to 165 lbs.  Her last TSH was in 2019 == 1.130.  She reports sweating more recently, especially when in hot room.  Fluctuating energy levels.  Has noticed nails are more brittle -- started hair, skin, and nail vitamin -- noticed this change about a month. Fatigue: occasional Cold intolerance: no Heat intolerance: yes Weight gain: yes Weight loss: no Constipation: no Diarrhea/loose stools: no Palpitations: no Lower extremity edema: no Anxiety/depressed mood: a little more irritability  Relevant past medical, surgical, family and social history reviewed and updated as indicated. Interim medical history since our last visit reviewed. Allergies and medications reviewed and updated.  Review of Systems  Constitutional: Negative for activity change, appetite change, diaphoresis, fatigue and fever.  Respiratory: Negative for cough, chest tightness and shortness of breath.   Cardiovascular: Negative for chest pain, palpitations and leg swelling.  Gastrointestinal: Negative.   Endocrine: Negative for cold intolerance, heat intolerance, polydipsia, polyphagia and polyuria.  Neurological: Negative.   Psychiatric/Behavioral: Negative.     Per HPI unless specifically indicated above     Objective:    BP 122/86   Pulse 75   Temp 98.2 F (36.8 C)   Wt 185 lb 4 oz (84 kg)   SpO2 98%   BMI 33.34 kg/m   Wt Readings from Last 3 Encounters:  01/23/20 185 lb 4 oz (84 kg)  03/30/19 177 lb (80.3 kg)  01/24/19 177 lb (80.3 kg)    Physical Exam Vitals and nursing note reviewed.  Constitutional:      General:  She is awake. She is not in acute distress.    Appearance: She is well-developed and well-groomed. She is obese. She is not ill-appearing.  HENT:     Head: Normocephalic.     Right Ear: Hearing normal.     Left Ear: Hearing normal.  Eyes:     General: Lids are normal.        Right eye: No discharge.        Left eye: No discharge.     Conjunctiva/sclera: Conjunctivae normal.     Pupils: Pupils are equal, round, and reactive to light.  Neck:     Thyroid: No thyromegaly.     Vascular: No carotid bruit.  Cardiovascular:     Rate and Rhythm: Normal rate and regular rhythm.     Heart sounds: Normal heart sounds. No murmur heard. No gallop.   Pulmonary:     Effort: Pulmonary effort is normal. No accessory muscle usage or respiratory distress.     Breath sounds: Normal breath sounds.  Abdominal:     General: Bowel sounds are normal.     Palpations: Abdomen is soft. There is no hepatomegaly or splenomegaly.  Musculoskeletal:     Cervical back: Normal range of motion and neck supple.     Right lower leg: No edema.     Left lower leg: No edema.  Skin:    General: Skin is warm and dry.  Neurological:     Mental Status: She is alert and oriented to person, place, and time.  Psychiatric:        Attention and Perception: Attention normal.        Mood and Affect: Mood normal.        Speech: Speech normal.        Behavior: Behavior normal. Behavior is cooperative.        Thought Content: Thought content normal.    Results for orders placed or performed in visit on 01/24/19  Microscopic Examination   URINE  Result Value Ref Range   WBC, UA None seen 0 - 5 /hpf   RBC 3-10 (A) 0 - 2 /hpf   Epithelial Cells (non renal) 0-10 0 - 10 /hpf   Bacteria, UA Few (A) None seen/Few  CBC with Differential/Platelet out  Result Value Ref Range   WBC 8.0 3.4 - 10.8 x10E3/uL   RBC 4.75 3.77 - 5.28 x10E6/uL   Hemoglobin 14.3 11.1 - 15.9 g/dL   Hematocrit 26.7 12.4 - 46.6 %   MCV 94 79 - 97 fL    MCH 30.1 26.6 - 33.0 pg   MCHC 31.9 31.5 - 35.7 g/dL   RDW 58.0 (L) 99.8 - 33.8 %   Platelets 383 150 - 450 x10E3/uL   Neutrophils 52 Not Estab. %   Lymphs 34 Not Estab. %   Monocytes 9 Not Estab. %   Eos 3 Not Estab. %  Basos 1 Not Estab. %   Neutrophils Absolute 4.2 1.4 - 7.0 x10E3/uL   Lymphocytes Absolute 2.7 0.7 - 3.1 x10E3/uL   Monocytes Absolute 0.7 0.1 - 0.9 x10E3/uL   EOS (ABSOLUTE) 0.3 0.0 - 0.4 x10E3/uL   Basophils Absolute 0.1 0.0 - 0.2 x10E3/uL   Immature Granulocytes 1 Not Estab. %   Immature Grans (Abs) 0.0 0.0 - 0.1 x10E3/uL  Comprehensive metabolic panel  Result Value Ref Range   Glucose 56 (L) 65 - 99 mg/dL   BUN 10 6 - 20 mg/dL   Creatinine, Ser 1.610.71 0.57 - 1.00 mg/dL   GFR calc non Af Amer 108 >59 mL/min/1.73   GFR calc Af Amer 124 >59 mL/min/1.73   BUN/Creatinine Ratio 14 9 - 23   Sodium 138 134 - 144 mmol/L   Potassium 4.0 3.5 - 5.2 mmol/L   Chloride 102 96 - 106 mmol/L   CO2 21 20 - 29 mmol/L   Calcium 8.7 8.7 - 10.2 mg/dL   Total Protein 7.1 6.0 - 8.5 g/dL   Albumin 4.5 3.8 - 4.8 g/dL   Globulin, Total 2.6 1.5 - 4.5 g/dL   Albumin/Globulin Ratio 1.7 1.2 - 2.2   Bilirubin Total 0.3 0.0 - 1.2 mg/dL   Alkaline Phosphatase 81 39 - 117 IU/L   AST 28 0 - 40 IU/L   ALT 29 0 - 32 IU/L  Lipid Panel w/o Chol/HDL Ratio out  Result Value Ref Range   Cholesterol, Total 147 100 - 199 mg/dL   Triglycerides 72 0 - 149 mg/dL   HDL 70 >09>39 mg/dL   VLDL Cholesterol Cal 14 5 - 40 mg/dL   LDL Chol Calc (NIH) 63 0 - 99 mg/dL  TSH  Result Value Ref Range   TSH 1.730 0.450 - 4.500 uIU/mL  UA/M w/rflx Culture, Routine   Specimen: Urine   URINE  Result Value Ref Range   Specific Gravity, UA 1.015 1.005 - 1.030   pH, UA 7.0 5.0 - 7.5   Color, UA Yellow Yellow   Appearance Ur Clear Clear   Leukocytes,UA Negative Negative   Protein,UA Negative Negative/Trace   Glucose, UA Negative Negative   Ketones, UA Negative Negative   RBC, UA Trace (A) Negative   Bilirubin,  UA Negative Negative   Urobilinogen, Ur 0.2 0.2 - 1.0 mg/dL   Nitrite, UA Negative Negative   Microscopic Examination See below:       Assessment & Plan:   Problem List Items Addressed This Visit      Musculoskeletal and Integument   Brittle nails    Continue supplement at home -- check TSH, Free T4, B12, Vit D, and BMP today.      Relevant Orders   T4, free   TSH   Basic metabolic panel     Other   Family history of breast cancer    Start mammogram screening due to history, ordered today.      Relevant Orders   MM DIGITAL SCREENING BILATERAL   Nicotine dependence, cigarettes, uncomplicated    Currently has not smoked tobacco in one year --- is currently vaping.  Recommend complete cessation.      Anxiety - Primary    Chronic, ongoing.  Stable at this time with no SI/HI.  Continue Effexor daily and Klonopin as needed only.  She minimally uses Klonopin and needs no refills today -- if continues treatment get UDS and controlled substance contract next visit.  Return in one year for follow-up, sooner if  worsening mood or changes.        Relevant Medications   venlafaxine XR (EFFEXOR XR) 75 MG 24 hr capsule   Weight gain    Multiple factors, less exercise, smoking cessation -- check TSH, Free T4, B12, Vit D, and BMP today.      Relevant Orders   T4, free   TSH   Vitamin B12   VITAMIN D 25 Hydroxy (Vit-D Deficiency, Fractures)   Basic metabolic panel   Obesity    Recommended eating smaller high protein, low fat meals more frequently and exercising 30 mins a day 5 times a week with a goal of 10-15lb weight loss in the next 3 months. Patient voiced their understanding and motivation to adhere to these recommendations.        Other Visit Diagnoses    Immunization due       Flu vaccine today   Relevant Orders   Flu Vaccine QUAD 6+ mos PF IM (Fluarix Quad PF) (Completed)       Follow up plan: Return in about 1 year (around 01/22/2021) for Annual physical and meet  new PCP.

## 2020-01-23 NOTE — Assessment & Plan Note (Signed)
Multiple factors, less exercise, smoking cessation -- check TSH, Free T4, B12, Vit D, and BMP today.

## 2020-01-23 NOTE — Telephone Encounter (Signed)
Pt would like to know if Cannady,Jolene NP would be able to become her new PCP.

## 2020-01-23 NOTE — Patient Instructions (Addendum)
[facility]  Address: [address] [place], [place], [place] [address]  Phone: [phone]  Healthy Eating Following a healthy eating pattern may help you to achieve and maintain a healthy body weight, reduce the risk of chronic disease, and live a long and productive life. It is important to follow a healthy eating pattern at an appropriate calorie level for your body. Your nutritional needs should be met primarily through food by choosing a variety of nutrient-rich foods. What are tips for following this plan? Reading food labels  Read labels and choose the following: ? Reduced or low sodium. ? Juices with 100% fruit juice. ? Foods with low saturated fats and high polyunsaturated and monounsaturated fats. ? Foods with whole grains, such as whole wheat, cracked wheat, brown rice, and wild rice. ? Whole grains that are fortified with folic acid. This is recommended for women who are pregnant or who want to become pregnant.  Read labels and avoid the following: ? Foods with a lot of added sugars. These include foods that contain brown sugar, corn sweetener, corn syrup, dextrose, fructose, glucose, high-fructose corn syrup, honey, invert sugar, lactose, malt syrup, maltose, molasses, raw sugar, sucrose, trehalose, or turbinado sugar.  Do not eat more than the following amounts of added sugar per day:  6 teaspoons (25 g) for women.  9 teaspoons (38 g) for men. ? Foods that contain processed or refined starches and grains. ? Refined grain products, such as white flour, degermed cornmeal, white bread, and white rice. Shopping  Choose nutrient-rich snacks, such as vegetables, whole fruits, and nuts. Avoid high-calorie and high-sugar snacks, such as potato chips, fruit snacks, and candy.  Use oil-based dressings and spreads on foods instead of solid fats such as butter, stick margarine, or cream cheese.  Limit pre-made sauces, mixes, and "instant" products  such as flavored rice, instant noodles, and ready-made pasta.  Try more plant-protein sources, such as tofu, tempeh, black beans, edamame, lentils, nuts, and seeds.  Explore eating plans such as the Mediterranean diet or vegetarian diet. Cooking  Use oil to saut or stir-fry foods instead of solid fats such as butter, stick margarine, or lard.  Try baking, boiling, grilling, or broiling instead of frying.  Remove the fatty part of meats before cooking.  Steam vegetables in water or broth. Meal planning   At meals, imagine dividing your plate into fourths: ? One-half of your plate is fruits and vegetables. ? One-fourth of your plate is whole grains. ? One-fourth of your plate is protein, especially lean meats, poultry, eggs, tofu, beans, or nuts.  Include low-fat dairy as part of your daily diet. Lifestyle  Choose healthy options in all settings, including home, work, school, restaurants, or stores.  Prepare your food safely: ? Wash your hands after handling raw meats. ? Keep food preparation surfaces clean by regularly washing with hot, soapy water. ? Keep raw meats separate from ready-to-eat foods, such as fruits and vegetables. ? Cook seafood, meat, poultry, and eggs to the recommended internal temperature. ? Store foods at safe temperatures. In general:  Keep cold foods at 28F (4.4C) or below.  Keep hot foods at 128F (60C) or above.  Keep your freezer at [facility] (-17.8C) or below.  Foods are no longer safe to eat when they have been between the temperatures of 40-128F (4.4-60C) for more than 2 hours. What foods should I eat? Fruits Aim to eat 2 cup-equivalents of fresh, canned (in natural juice), or frozen fruits  each day. Examples of 1 cup-equivalent of fruit include 1 small apple, 8 large strawberries, 1 cup canned fruit,  cup dried fruit, or 1 cup 100% juice. Vegetables Aim to eat 2-3 cup-equivalents of fresh and frozen vegetables each day, including different  varieties and colors. Examples of 1 cup-equivalent of vegetables include 2 medium carrots, 2 cups raw, leafy greens, 1 cup chopped vegetable (raw or cooked), or 1 medium baked potato. Grains Aim to eat 6 ounce-equivalents of whole grains each day. Examples of 1 ounce-equivalent of grains include 1 slice of bread, 1 cup ready-to-eat cereal, 3 cups popcorn, or  cup cooked rice, pasta, or cereal. Meats and other proteins Aim to eat 5-6 ounce-equivalents of protein each day. Examples of 1 ounce-equivalent of protein include 1 egg, 1/2 cup nuts or seeds, or 1 tablespoon (16 g) peanut butter. A cut of meat or fish that is the size of a deck of cards is about 3-4 ounce-equivalents.  Of the protein you eat each week, try to have at least 8 ounces come from seafood. This includes salmon, trout, herring, and anchovies. Dairy Aim to eat 3 cup-equivalents of fat-free or low-fat dairy each day. Examples of 1 cup-equivalent of dairy include 1 cup (240 mL) milk, 8 ounces (250 g) yogurt, 1 ounces (44 g) natural cheese, or 1 cup (240 mL) fortified soy milk. Fats and oils  Aim for about 5 teaspoons (21 g) per day. Choose monounsaturated fats, such as canola and olive oils, avocados, peanut butter, and most nuts, or polyunsaturated fats, such as sunflower, corn, and soybean oils, walnuts, pine nuts, sesame seeds, sunflower seeds, and flaxseed. Beverages  Aim for six 8-oz glasses of water per day. Limit coffee to three to five 8-oz cups per day.  Limit caffeinated beverages that have added calories, such as soda and energy drinks.  Limit alcohol intake to no more than 1 drink a day for nonpregnant women and 2 drinks a day for men. One drink equals 12 oz of beer (355 mL), 5 oz of wine (148 mL), or 1 oz of hard liquor (44 mL). Seasoning and other foods  Avoid adding excess amounts of salt to your foods. Try flavoring foods with herbs and spices instead of salt.  Avoid adding sugar to foods.  Try using  oil-based dressings, sauces, and spreads instead of solid fats. This information is based on general [place] nutrition guidelines. For more information, visit [employer]. Exact amounts may vary based on your nutrition needs. Summary  A healthy eating plan may help you to maintain a healthy weight, reduce the risk of chronic diseases, and stay active throughout your life.  Plan your meals. Make sure you eat the right portions of a variety of nutrient-rich foods.  Try baking, boiling, grilling, or broiling instead of frying.  Choose healthy options in all settings, including home, work, school, restaurants, or stores. This information is not intended to replace advice given to you by your health care provider. Make sure you discuss any questions you have with your health care provider. Document Revised: 04/27/2017 Document Reviewed: 04/27/2017 Elsevier Patient Education  [place].

## 2020-01-23 NOTE — Assessment & Plan Note (Signed)
Continue supplement at home -- check TSH, Free T4, B12, Vit D, and BMP today.

## 2020-01-23 NOTE — Assessment & Plan Note (Signed)
Currently has not smoked tobacco in one year --- is currently vaping.  Recommend complete cessation.

## 2020-01-23 NOTE — Assessment & Plan Note (Signed)
Recommended eating smaller high protein, low fat meals more frequently and exercising 30 mins a day 5 times a week with a goal of 10-15lb weight loss in the next 3 months. Patient voiced their understanding and motivation to adhere to these recommendations.  

## 2020-01-23 NOTE — Assessment & Plan Note (Addendum)
Chronic, ongoing.  Stable at this time with no SI/HI.  Continue Effexor daily and Klonopin as needed only.  She minimally uses Klonopin and needs no refills today -- if continues treatment get UDS and controlled substance contract next visit.  Return in one year for follow-up, sooner if worsening mood or changes.

## 2020-01-23 NOTE — Assessment & Plan Note (Signed)
Start mammogram screening due to history, ordered today.

## 2020-01-23 NOTE — Telephone Encounter (Signed)
I am okay with transition over to me

## 2020-01-24 LAB — BASIC METABOLIC PANEL
BUN/Creatinine Ratio: 15 (ref 9–23)
BUN: 10 mg/dL (ref 6–24)
CO2: 23 mmol/L (ref 20–29)
Calcium: 9.5 mg/dL (ref 8.7–10.2)
Chloride: 103 mmol/L (ref 96–106)
Creatinine, Ser: 0.66 mg/dL (ref 0.57–1.00)
GFR calc Af Amer: 128 mL/min/{1.73_m2} (ref >59)
GFR calc non Af Amer: 111 mL/min/{1.73_m2} (ref >59)
Glucose: 71 mg/dL (ref 65–99)
Potassium: 4.5 mmol/L (ref 3.5–5.2)
Sodium: 139 mmol/L (ref 134–144)

## 2020-01-24 LAB — TSH: TSH: 1.38 u[IU]/mL (ref 0.450–4.500)

## 2020-01-24 LAB — VITAMIN B12: Vitamin B-12: 669 pg/mL (ref 232–1245)

## 2020-01-24 LAB — T4, FREE: Free T4: 0.88 ng/dL (ref 0.82–1.77)

## 2020-01-24 LAB — VITAMIN D 25 HYDROXY (VIT D DEFICIENCY, FRACTURES): Vit D, 25-Hydroxy: 16 ng/mL — ABNORMAL LOW (ref 30.0–100.0)

## 2020-01-24 NOTE — Progress Notes (Signed)
Contacted via MyChart   Good afternoon Molly Lynch, your labs have returned and thyroid is normal.  B12 level is normal.  Kidney function and electrolytes are normal.  Vitamin D level is on low side, I would recommend starting over the counter Vitamin D3 2000 units daily -- this is beneficial for overall bone, muscle health and may help nails as well.  We will recheck level next visit -- if ongoing lower level we may change to weekly higher dosing for a bit.  Have a great day and if any questions let me know. Keep being awesome!!  Thank you for allowing me to participate in your care. Kindest regards, Jasmeet Manton

## 2020-03-19 ENCOUNTER — Ambulatory Visit: Payer: No Typology Code available for payment source | Admitting: Nurse Practitioner

## 2020-03-19 ENCOUNTER — Other Ambulatory Visit: Payer: Self-pay

## 2020-03-19 ENCOUNTER — Encounter: Payer: Self-pay | Admitting: Nurse Practitioner

## 2020-03-19 VITALS — BP 113/78 | HR 58 | Temp 97.7°F | Wt 187.2 lb

## 2020-03-19 DIAGNOSIS — Z8489 Family history of other specified conditions: Secondary | ICD-10-CM | POA: Insufficient documentation

## 2020-03-19 NOTE — Patient Instructions (Signed)
Healthy Eating Following a healthy eating pattern may help you to achieve and maintain a healthy body weight, reduce the risk of chronic disease, and live a long and productive life. It is important to follow a healthy eating pattern at an appropriate calorie level for your body. Your nutritional needs should be met primarily through food by choosing a variety of nutrient-rich foods. What are tips for following this plan? Reading food labels  Read labels and choose the following: ? Reduced or low sodium. ? Juices with 100% fruit juice. ? Foods with low saturated fats and high polyunsaturated and monounsaturated fats. ? Foods with whole grains, such as whole wheat, cracked wheat, brown rice, and wild rice. ? Whole grains that are fortified with folic acid. This is recommended for women who are pregnant or who want to become pregnant.  Read labels and avoid the following: ? Foods with a lot of added sugars. These include foods that contain brown sugar, corn sweetener, corn syrup, dextrose, fructose, glucose, high-fructose corn syrup, honey, invert sugar, lactose, malt syrup, maltose, molasses, raw sugar, sucrose, trehalose, or turbinado sugar.  Do not eat more than the following amounts of added sugar per day:  6 teaspoons (25 g) for women.  9 teaspoons (38 g) for men. ? Foods that contain processed or refined starches and grains. ? Refined grain products, such as white flour, degermed cornmeal, white bread, and white rice. Shopping  Choose nutrient-rich snacks, such as vegetables, whole fruits, and nuts. Avoid high-calorie and high-sugar snacks, such as potato chips, fruit snacks, and candy.  Use oil-based dressings and spreads on foods instead of solid fats such as butter, stick margarine, or cream cheese.  Limit pre-made sauces, mixes, and "instant" products such as flavored rice, instant noodles, and ready-made pasta.  Try more plant-protein sources, such as tofu, tempeh, black beans,  edamame, lentils, nuts, and seeds.  Explore eating plans such as the Mediterranean diet or vegetarian diet. Cooking  Use oil to saut or stir-fry foods instead of solid fats such as butter, stick margarine, or lard.  Try baking, boiling, grilling, or broiling instead of frying.  Remove the fatty part of meats before cooking.  Steam vegetables in water or broth. Meal planning  At meals, imagine dividing your plate into fourths: ? One-half of your plate is fruits and vegetables. ? One-fourth of your plate is whole grains. ? One-fourth of your plate is protein, especially lean meats, poultry, eggs, tofu, beans, or nuts.  Include low-fat dairy as part of your daily diet.   Lifestyle  Choose healthy options in all settings, including home, work, school, restaurants, or stores.  Prepare your food safely: ? Wash your hands after handling raw meats. ? Keep food preparation surfaces clean by regularly washing with hot, soapy water. ? Keep raw meats separate from ready-to-eat foods, such as fruits and vegetables. ? Cook seafood, meat, poultry, and eggs to the recommended internal temperature. ? Store foods at safe temperatures. In general:  Keep cold foods at 7F (4.4C) or below.  Keep hot foods at 17F (60C) or above.  Keep your freezer at [facility] (-17.8C) or below.  Foods are no longer safe to eat when they have been between the temperatures of 40-17F (4.4-60C) for more than 2 hours. What foods should I eat? Fruits Aim to eat 2 cup-equivalents of fresh, canned (in natural juice), or frozen fruits each day. Examples of 1 cup-equivalent of fruit include 1 small apple, 8 large strawberries, 1 cup canned fruit,  cup dried fruit, or 1 cup 100% juice. Vegetables Aim to eat 2-3 cup-equivalents of fresh and frozen vegetables each day, including different varieties and colors. Examples of 1 cup-equivalent of vegetables include 2 medium carrots, 2 cups raw, leafy greens, 1 cup chopped  vegetable (raw or cooked), or 1 medium baked potato. Grains Aim to eat 6 ounce-equivalents of whole grains each day. Examples of 1 ounce-equivalent of grains include 1 slice of bread, 1 cup ready-to-eat cereal, 3 cups popcorn, or  cup cooked rice, pasta, or cereal. Meats and other proteins Aim to eat 5-6 ounce-equivalents of protein each day. Examples of 1 ounce-equivalent of protein include 1 egg, 1/2 cup nuts or seeds, or 1 tablespoon (16 g) peanut butter. A cut of meat or fish that is the size of a deck of cards is about 3-4 ounce-equivalents.  Of the protein you eat each week, try to have at least 8 ounces come from seafood. This includes salmon, trout, herring, and anchovies. Dairy Aim to eat 3 cup-equivalents of fat-free or low-fat dairy each day. Examples of 1 cup-equivalent of dairy include 1 cup (240 mL) milk, 8 ounces (250 g) yogurt, 1 ounces (44 g) natural cheese, or 1 cup (240 mL) fortified soy milk. Fats and oils  Aim for about 5 teaspoons (21 g) per day. Choose monounsaturated fats, such as canola and olive oils, avocados, peanut butter, and most nuts, or polyunsaturated fats, such as sunflower, corn, and soybean oils, walnuts, pine nuts, sesame seeds, sunflower seeds, and flaxseed. Beverages  Aim for six 8-oz glasses of water per day. Limit coffee to three to five 8-oz cups per day.  Limit caffeinated beverages that have added calories, such as soda and energy drinks.  Limit alcohol intake to no more than 1 drink a day for nonpregnant women and 2 drinks a day for men. One drink equals 12 oz of beer (355 mL), 5 oz of wine (148 mL), or 1 oz of hard liquor (44 mL). Seasoning and other foods  Avoid adding excess amounts of salt to your foods. Try flavoring foods with herbs and spices instead of salt.  Avoid adding sugar to foods.  Try using oil-based dressings, sauces, and spreads instead of solid fats. This information is based on general [place] nutrition guidelines. For more  information, visit choosemyplate.gov. Exact amounts may vary based on your nutrition needs. Summary  A healthy eating plan may help you to maintain a healthy weight, reduce the risk of chronic diseases, and stay active throughout your life.  Plan your meals. Make sure you eat the right portions of a variety of nutrient-rich foods.  Try baking, boiling, grilling, or broiling instead of frying.  Choose healthy options in all settings, including home, work, school, restaurants, or stores. This information is not intended to replace advice given to you by your health care provider. Make sure you discuss any questions you have with your health care provider. Document Revised: 04/27/2017 Document Reviewed: 04/27/2017 Elsevier Patient Education  2021 Elsevier Inc.  

## 2020-03-19 NOTE — Progress Notes (Signed)
BP 113/78   Pulse (!) 58   Temp 97.7 F (36.5 C) (Oral)   Wt 187 lb 3.2 oz (84.9 kg)   SpO2 100%   BMI 33.69 kg/m    Subjective:    Patient ID: Molly Lynch, female    DOB: 1980-10-08, 40 y.o.   MRN: [id number]  HPI: Molly Lynch is a 40 y.o. female  Chief Complaint  Patient presents with  . genetic testing    Factor II Prothrombin Gene Mutation . Uncle was diagnosed a couple of months ago had clots in his lungs, dad recently diagnosed recently no symptoms and then her uncle's daughter was diagnosed no symptoms. Pt would like to have genetic testing, but no symptoms.    REFERRAL NEED: Her uncle was diagnosed with Factor II Prothrombin Gene Mutation and had clots in lungs -- is still living, then her father was recently diagnosed and has no symptoms + her uncle's daughter also was diagnosed with no symptoms as well.  Patient is requesting genetic testing for this.  Patient has no history of clots.  She denies easy healing of wounds or increased bleeding.  No easy bruising.    Her grandmother has history of breast cancer and is interested in testing for this.  Relevant past medical, surgical, family and social history reviewed and updated as indicated. Interim medical history since our last visit reviewed. Allergies and medications reviewed and updated.  Review of Systems  Constitutional: Negative for activity change, appetite change, diaphoresis, fatigue and fever.  Respiratory: Negative for cough, chest tightness and shortness of breath.   Cardiovascular: Negative for chest pain, palpitations and leg swelling.  Gastrointestinal: Negative.   Neurological: Negative.   Psychiatric/Behavioral: Negative.     Per HPI unless specifically indicated above     Objective:    BP 113/78   Pulse (!) 58   Temp 97.7 F (36.5 C) (Oral)   Wt 187 lb 3.2 oz (84.9 kg)   SpO2 100%   BMI 33.69 kg/m   Wt Readings from Last 3 Encounters:  03/19/20 187 lb 3.2 oz (84.9 kg)   01/23/20 185 lb 4 oz (84 kg)  03/30/19 177 lb (80.3 kg)    Physical Exam Vitals and nursing note reviewed.  Constitutional:      General: She is awake. She is not in acute distress.    Appearance: She is well-developed and well-groomed. She is obese. She is not ill-appearing.  HENT:     Head: Normocephalic.     Right Ear: Hearing normal.     Left Ear: Hearing normal.  Eyes:     General: Lids are normal.        Right eye: No discharge.        Left eye: No discharge.     Conjunctiva/sclera: Conjunctivae normal.     Pupils: Pupils are equal, round, and reactive to light.  Neck:     Thyroid: No thyromegaly.     Vascular: No carotid bruit.  Cardiovascular:     Rate and Rhythm: Normal rate and regular rhythm.     Heart sounds: Normal heart sounds. No murmur heard. No gallop.   Pulmonary:     Effort: Pulmonary effort is normal. No accessory muscle usage or respiratory distress.     Breath sounds: Normal breath sounds.  Abdominal:     General: Bowel sounds are normal.     Palpations: Abdomen is soft. There is no hepatomegaly or splenomegaly.  Musculoskeletal:  Cervical back: Normal range of motion and neck supple.     Right lower leg: No edema.     Left lower leg: No edema.  Skin:    General: Skin is warm and dry.  Neurological:     Mental Status: She is alert and oriented to person, place, and time.  Psychiatric:        Attention and Perception: Attention normal.        Mood and Affect: Mood normal.        Speech: Speech normal.        Behavior: Behavior normal. Behavior is cooperative.        Thought Content: Thought content normal.     Results for orders placed or performed in visit on 01/23/20  T4, free  Result Value Ref Range   Free T4 0.88 0.82 - 1.77 ng/dL  TSH  Result Value Ref Range   TSH 1.380 0.450 - 4.500 uIU/mL  Vitamin B12  Result Value Ref Range   Vitamin B-12 669 232 - 1,245 pg/mL  VITAMIN D 25 Hydroxy (Vit-D Deficiency, Fractures)  Result Value  Ref Range   Vit D, 25-Hydroxy 16.0 (L) 30.0 - 100.0 ng/mL  Basic metabolic panel  Result Value Ref Range   Glucose 71 65 - 99 mg/dL   BUN 10 6 - 24 mg/dL   Creatinine, Ser 6.75 0.57 - 1.00 mg/dL   GFR calc non Af Amer 111 >59 mL/min/1.73   GFR calc Af Amer 128 >59 mL/min/1.73   BUN/Creatinine Ratio 15 9 - 23   Sodium 139 134 - 144 mmol/L   Potassium 4.5 3.5 - 5.2 mmol/L   Chloride 103 96 - 106 mmol/L   CO2 23 20 - 29 mmol/L   Calcium 9.5 8.7 - 10.2 mg/dL      Assessment & Plan:   Problem List Items Addressed This Visit      Other   Family history of genetic disorder - Primary    Referral to hematology for further evaluation.        Relevant Orders   Ambulatory referral to Hematology       Follow up plan: Return if symptoms worsen or fail to improve.

## 2020-03-19 NOTE — Assessment & Plan Note (Signed)
Referral to hematology for further evaluation.

## 2020-03-23 ENCOUNTER — Encounter: Payer: Self-pay | Admitting: Oncology

## 2020-03-23 ENCOUNTER — Inpatient Hospital Stay: Payer: No Typology Code available for payment source

## 2020-03-23 ENCOUNTER — Inpatient Hospital Stay: Payer: No Typology Code available for payment source | Attending: Oncology | Admitting: Oncology

## 2020-03-23 VITALS — BP 133/93 | HR 69 | Temp 96.9°F | Resp 18 | Wt 185.6 lb

## 2020-03-23 DIAGNOSIS — Z832 Family history of diseases of the blood and blood-forming organs and certain disorders involving the immune mechanism: Secondary | ICD-10-CM | POA: Diagnosis not present

## 2020-03-23 DIAGNOSIS — Z808 Family history of malignant neoplasm of other organs or systems: Secondary | ICD-10-CM

## 2020-03-23 DIAGNOSIS — D6852 Prothrombin gene mutation: Secondary | ICD-10-CM | POA: Diagnosis not present

## 2020-03-23 DIAGNOSIS — Z87891 Personal history of nicotine dependence: Secondary | ICD-10-CM | POA: Diagnosis not present

## 2020-03-23 DIAGNOSIS — Z803 Family history of malignant neoplasm of breast: Secondary | ICD-10-CM | POA: Insufficient documentation

## 2020-03-23 NOTE — Progress Notes (Signed)
Hematology/Oncology Consult note [facility] Telephone:([phone] [phone]   Patient Care Team: Marjie Skiff, NP as PCP - General (Nurse Practitioner)  REFERRING PROVIDER: Marjie Skiff, NP  CHIEF COMPLAINTS/REASON FOR VISIT:  Evaluation of family history of prothrombin mutation.  HISTORY OF PRESENTING ILLNESS:   Molly Lynch is a  40 y.o.  female with PMH listed below was seen in consultation at the request of  Marjie Skiff, NP  for evaluation of prothrombin mutation family history.  Patient reports that her uncle was diagnosed with prothrombin gene mutation also had blood clot. Father also carries the mutation.  Patient denies any previous history of thrombosis. Maternal grandmother was diagnosed with breast cancer at age of 67. Patient reports feeling well at her baseline today.  She has bilateral screening mammogram scheduled in March 2022. Denies any constitutional symptoms.  Review of Systems  Constitutional: Negative for appetite change, chills, fatigue and fever.  HENT:   Negative for hearing loss and voice change.   Eyes: Negative for eye problems.  Respiratory: Negative for chest tightness and cough.   Cardiovascular: Negative for chest pain.  Gastrointestinal: Negative for abdominal distention, abdominal pain and blood in stool.  Endocrine: Negative for hot flashes.  Genitourinary: Negative for difficulty urinating and frequency.   Musculoskeletal: Negative for arthralgias.  Skin: Negative for itching and rash.  Neurological: Negative for extremity weakness.  Hematological: Negative for adenopathy.  Psychiatric/Behavioral: Negative for confusion.    MEDICAL HISTORY:  Past Medical History:  Diagnosis Date  . Anxiety   . Kidney stones   . Migraine   . Oral herpes   . Pyelonephritis     SURGICAL HISTORY: Past Surgical History:  Procedure Laterality Date  . NO PAST SURGERIES      SOCIAL  HISTORY: Social History   Socioeconomic History  . Marital status: Married    Spouse name: Not on file  . Number of children: Not on file  . Years of education: Not on file  . Highest education level: Not on file  Occupational History  . Not on file  Tobacco Use  . Smoking status: Former Smoker    Packs/day: 0.25    Years: 25.00    Pack years: 6.25    Types: Cigarettes    Quit date: 03/24/2019    Years since quitting: 1.0  . Smokeless tobacco: Never Used  . Tobacco comment: smoked off and on since the age of 7  Vaping Use  . Vaping Use: Some days  . Substances: Nicotine  Substance and Sexual Activity  . Alcohol use: Yes    Comment: socially  . Drug use: No  . Sexual activity: Yes    Birth control/protection: I.U.D.  Other Topics Concern  . Not on file  Social History Narrative  . Not on file   Social Determinants of Health   Financial Resource Strain: Not on file  Food Insecurity: Not on file  Transportation Needs: Not on file  Physical Activity: Not on file  Stress: Not on file  Social Connections: Not on file  Intimate Partner Violence: Not on file    FAMILY HISTORY: Family History  Problem Relation Age of Onset  . Alcohol abuse Mother   . Mental illness Mother   . Thyroid disease Mother   . Cancer Maternal Grandmother 46       breast  . Diabetes Maternal Grandmother   . Heart disease Maternal Grandmother        Multiple  MIs and CABG  . Hip fracture Maternal Grandmother   . Hypertension Maternal Grandfather   . Colon polyps Maternal Grandfather 52  . Diabetes Paternal Grandmother   . Cancer Maternal Aunt        Thyroid gland  . Clotting disorder Father        Factor II Prothrombin Gene Mutation  . Clotting disorder Paternal Uncle        Factor II Prothrombin Gene Mutation    ALLERGIES:  has No Known Allergies.  MEDICATIONS:  Current Outpatient Medications  Medication Sig Dispense Refill  . clonazePAM (KLONOPIN) 0.5 MG tablet Take 0.5 tablets  (0.25 mg total) by mouth daily as needed for anxiety. 30 tablet 0  . levonorgestrel (MIRENA) 20 MCG/24HR IUD 1 each by Intrauterine route once.    . Probiotic Product (PROBIOTIC DAILY PO) Take by mouth 2 (two) times daily.    Marland Kitchen venlafaxine XR (EFFEXOR XR) 75 MG 24 hr capsule Take 1 capsule (75 mg total) by mouth daily with breakfast. 90 capsule 4  . ipratropium (ATROVENT) 0.06 % nasal spray Place 2 sprays into both nostrils 4 (four) times daily. (Patient not taking: Reported on 03/23/2020) 15 mL 3   No current facility-administered medications for this visit.     PHYSICAL EXAMINATION: ECOG PERFORMANCE STATUS: 0 - Asymptomatic Vitals:   03/23/20 1113  BP: (!) 133/93  Pulse: 69  Resp: 18  Temp: (!) 96.9 F (36.1 C)   Filed Weights   03/23/20 1113  Weight: 185 lb 9.6 oz (84.2 kg)    Physical Exam Constitutional:      General: She is not in acute distress. HENT:     Head: Normocephalic and atraumatic.  Eyes:     General: No scleral icterus. Cardiovascular:     Rate and Rhythm: Normal rate and regular rhythm.     Heart sounds: Normal heart sounds.  Pulmonary:     Effort: Pulmonary effort is normal. No respiratory distress.     Breath sounds: No wheezing.  Abdominal:     General: Bowel sounds are normal. There is no distension.     Palpations: Abdomen is soft.  Musculoskeletal:        General: No deformity. Normal range of motion.     Cervical back: Normal range of motion and neck supple.  Skin:    General: Skin is warm and dry.     Findings: No erythema or rash.  Neurological:     Mental Status: She is alert and oriented to person, place, and time. Mental status is at baseline.     Cranial Nerves: No cranial nerve deficit.     Coordination: Coordination normal.  Psychiatric:        Mood and Affect: Mood normal.     LABORATORY DATA:  I have reviewed the data as listed Lab Results  Component Value Date   WBC 8.0 01/24/2019   HGB 14.3 01/24/2019   HCT 44.8  01/24/2019   MCV 94 01/24/2019   PLT 383 01/24/2019   Recent Labs    01/23/20 1135  NA 139  K 4.5  CL 103  CO2 23  GLUCOSE 71  BUN 10  CREATININE 0.66  CALCIUM 9.5  GFRNONAA 111  GFRAA 128   Iron/TIBC/Ferritin/ %Sat No results found for: IRON, TIBC, FERRITIN, IRONPCTSAT    RADIOGRAPHIC STUDIES: I have personally reviewed the radiological images as listed and agreed with the findings in the report. No results found.    ASSESSMENT & PLAN:  1. Family history of breast cancer   2. FHx: prothrombin gene mutation    #Family history of prothrombin gene mutation No personal history of thrombosis. Check prothrombin gene mutation status.  #Family history of breast cancer.  Refer to Dentist.  Orders Placed This Encounter  Procedures  . Prothrombin gene mutation    Standing Status:   Future    Number of Occurrences:   1    Standing Expiration Date:   03/23/2021  . Ambulatory referral to Genetics    Referral Priority:   Routine    Referral Type:   Consultation    Referral Reason:   Specialty Services Required    Number of Visits Requested:   1    All questions were answered. The patient knows to call the clinic with any problems questions or concerns.   Marjie Skiff, NP    Return of visit: Patient prefers to have results communicated.  MyChart or phone call. Thank you for this kind referral and the opportunity to participate in the care of this patient. A copy of today's note is routed to referring provider    Rickard Patience, MD, PhD Hematology Oncology [facility] at [facility] Pager- [phone] 03/23/2020

## 2020-03-23 NOTE — Progress Notes (Signed)
Patient here to establish care. Pt's father has factor II prothrombin gene mutation. Patient's maternal grandmother had breast cancer and she is interested in getting tested for BRCA mutation .

## 2020-03-28 LAB — PROTHROMBIN GENE MUTATION

## 2020-04-02 ENCOUNTER — Other Ambulatory Visit: Payer: Self-pay

## 2020-04-02 ENCOUNTER — Ambulatory Visit
Admission: RE | Admit: 2020-04-02 | Discharge: 2020-04-02 | Disposition: A | Discharge status: Home / Self Care | Payer: No Typology Code available for payment source | Source: Ambulatory Visit | Attending: Nurse Practitioner | Admitting: Nurse Practitioner

## 2020-04-02 DIAGNOSIS — Z1231 Encounter for screening mammogram for malignant neoplasm of breast: Secondary | ICD-10-CM | POA: Insufficient documentation

## 2020-04-02 DIAGNOSIS — Z803 Family history of malignant neoplasm of breast: Secondary | ICD-10-CM

## 2020-04-05 ENCOUNTER — Encounter: Payer: Self-pay | Admitting: Licensed Clinical Social Worker

## 2020-04-05 ENCOUNTER — Other Ambulatory Visit: Payer: Self-pay | Admitting: Nurse Practitioner

## 2020-04-05 ENCOUNTER — Inpatient Hospital Stay: Payer: No Typology Code available for payment source

## 2020-04-05 ENCOUNTER — Inpatient Hospital Stay: Payer: No Typology Code available for payment source | Attending: Oncology | Admitting: Licensed Clinical Social Worker

## 2020-04-05 DIAGNOSIS — Z803 Family history of malignant neoplasm of breast: Secondary | ICD-10-CM | POA: Diagnosis not present

## 2020-04-05 DIAGNOSIS — R928 Other abnormal and inconclusive findings on diagnostic imaging of breast: Secondary | ICD-10-CM

## 2020-04-05 DIAGNOSIS — N6320 Unspecified lump in the left breast, unspecified quadrant: Secondary | ICD-10-CM

## 2020-04-05 DIAGNOSIS — Z808 Family history of malignant neoplasm of other organs or systems: Secondary | ICD-10-CM | POA: Insufficient documentation

## 2020-04-05 NOTE — Progress Notes (Signed)
REFERRING PROVIDER: Earlie Server, MD [place],  [place] [address]  PRIMARY PROVIDER:  Venita Lick, NP  PRIMARY REASON FOR VISIT:  1. Family history of breast cancer   2. Family history of thyroid cancer       HISTORY OF PRESENT ILLNESS:   Ms. Hileman, a 40 y.o. female, was seen for a [place] cancer genetics consultation at the request of Dr. Tasia Catchings due to a family history of cancer.  Ms. Quesinberry presents to clinic today to discuss the possibility of a hereditary predisposition to cancer, genetic testing, and to further clarify her future cancer risks, as well as potential cancer risks for family members.    Ms. Prescott is a 40 y.o. female with no personal history of cancer.    CANCER HISTORY:  Oncology History   No history exists.     RISK FACTORS:  Menarche was at age 71.  First live birth at age 28.  Ovaries intact: yes.  Hysterectomy: no.  Menopausal status: premenopausal.  HRT use: 0 years. Colonoscopy: no; not examined. Mammogram within the last year: yes.   Past Medical History:  Diagnosis Date  . Anxiety   . Family history of breast cancer   . Family history of thyroid cancer   . Kidney stones   . Migraine   . Oral herpes   . Pyelonephritis     Past Surgical History:  Procedure Laterality Date  . NO PAST SURGERIES      Social History   Socioeconomic History  . Marital status: Married    Spouse name: Not on file  . Number of children: Not on file  . Years of education: Not on file  . Highest education level: Not on file  Occupational History  . Not on file  Tobacco Use  . Smoking status: Former Smoker    Packs/day: 0.25    Years: 25.00    Pack years: 6.25    Types: Cigarettes    Quit date: 03/24/2019    Years since quitting: 1.0  . Smokeless tobacco: Never Used  . Tobacco comment: smoked off and on since the age of 24  Vaping Use  . Vaping Use: Some days  . Substances: Nicotine  Substance and Sexual Activity  . Alcohol  use: Yes    Comment: socially  . Drug use: No  . Sexual activity: Yes    Birth control/protection: I.U.D.  Other Topics Concern  . Not on file  Social History Narrative  . Not on file   Social Determinants of Health   Financial Resource Strain: Not on file  Food Insecurity: Not on file  Transportation Needs: Not on file  Physical Activity: Not on file  Stress: Not on file  Social Connections: Not on file     FAMILY HISTORY:  We obtained a detailed, 4-generation family history.  Significant diagnoses are listed below: Family History  Problem Relation Age of Onset  . Alcohol abuse Mother   . Mental illness Mother   . Thyroid disease Mother   . Cancer Maternal Grandmother 46       breast  . Diabetes Maternal Grandmother   . Heart disease Maternal Grandmother        Multiple MIs and CABG  . Hip fracture Maternal Grandmother   . Breast cancer Maternal Grandmother   . Hypertension Maternal Grandfather   . Colon polyps Maternal Grandfather 17  . Diabetes Paternal Grandmother   . Cancer Maternal Aunt  Thyroid gland  . Breast cancer Maternal Aunt   . Clotting disorder Father        Factor II Prothrombin Gene Mutation  . Clotting disorder Paternal Uncle        Factor II Prothrombin Gene Mutation   Ms. Konczal has 1 son. She has a maternal half sister and paternal half brother, no cancers.   Ms. Yahr's mother is living at age 42, no cancers. She had 3 sisters and 1 brother. One of her sisters had breast cancer at 81 and thyroid at 9, passed at 31. Patient's maternal grandmother had breast cancer at 55 and died at 72. Her mother had face/sinus cancer, and her mother (patient's great-great grandmother) had breast cancer. Patient's maternal grandfather had prostate cancer and is living at 29. No other known cancers on this side of the family.  Ms. Jasek's father is living at 61. No known cancers on this side of the family aside from a cousin who passed from  metastatic skin cancer in his 69s.   Ms. Petrenko is unaware of previous family history of genetic testing for hereditary cancer risks. Patient's maternal ancestors are of French/Irish descent, and paternal ancestors are of unknown descent. There is no reported Ashkenazi Jewish ancestry. There is no known consanguinity.    GENETIC COUNSELING ASSESSMENT: Ms. Tallman is a 40 y.o. female with a family history of breast cancer which is somewhat suggestive of a hereditary cancer syndrome and predisposition to cancer. We, therefore, discussed and recommended the following at today's visit.   DISCUSSION: We discussed that approximately 5-10% of breast cancer is hereditary  Most cases of hereditary breast cancer are associated with BRCA1/BRCA2 genes, although there are other genes associated with hereditary cancer as well.  We discussed that testing is beneficial for several reasons including  knowing about cancer risks, identifying potential screening and risk-reduction options that may be appropriate, and to understand if other family members could be at risk for cancer and allow them to undergo genetic testing.   We reviewed the characteristics, features and inheritance patterns of hereditary cancer syndromes. We also discussed genetic testing, including the appropriate family members to test, the process of testing, insurance coverage and turn-around-time for results. We discussed the implications of a negative, positive and/or variant of uncertain significant result. We recommended Ms. Milan pursue genetic testing for the [facility] Multi-Cancer gene panel.   The Multi-Cancer Panel+RNA offered by [facility] includes sequencing and/or deletion duplication testing of the following 84 genes: AIP, ALK, APC, ATM, AXIN2,BAP1,  BARD1, BLM, BMPR1A, BRCA1, BRCA2, BRIP1, CASR, CDC73, CDH1, CDK4, CDKN1B, CDKN1C, CDKN2A (p14ARF), CDKN2A (p16INK4a), CEBPA, CHEK2, CTNNA1, DICER1, DIS3L2, EGFR (c.2369C>T, p.Thr790Met  variant only), EPCAM (Deletion/duplication testing only), FH, FLCN, GATA2, GPC3, GREM1 (Promoter region deletion/duplication testing only), HOXB13 (c.251G>A, p.Gly84Glu), HRAS, KIT, MAX, MEN1, MET, MITF (c.952G>A, p.Glu318Lys variant only), MLH1, MSH2, MSH3, MSH6, MUTYH, NBN, NF1, NF2, NTHL1, PALB2, PDGFRA, PHOX2B, PMS2, POLD1, POLE, POT1, PRKAR1A, PTCH1, PTEN, RAD50, RAD51C, RAD51D, RB1, RECQL4, RET, RUNX1, SDHAF2, SDHA (sequence changes only), SDHB, SDHC, SDHD, SMAD4, SMARCA4, SMARCB1, SMARCE1, STK11, SUFU, TERC, TERT, TMEM127, TP53, TSC1, TSC2, VHL, WRN and WT1.   Based on Ms. Spivey's family history of cancer, she meets medical criteria for genetic testing. Despite that she meets criteria, she may still have an out of pocket cost. We discussed that if her out of pocket cost for testing is over $100, the laboratory will call and confirm whether she wants to proceed with testing.  If the out of pocket  cost of testing is less than $100 she will be billed by the genetic testing laboratory.   We discussed that some people do not want to undergo genetic testing due to fear of genetic discrimination.  A federal law called the Genetic Information Non-Discrimination Act (GINA) of 2008 helps protect individuals against genetic discrimination based on their genetic test results.  It impacts both health insurance and employment.  For health insurance, it protects against increased premiums, being kicked off insurance or being forced to take a test in order to be insured.  For employment it protects against hiring, firing and promoting decisions based on genetic test results.  Health status due to a cancer diagnosis is not protected under GINA.  This law does not protect life insurance, disability insurance, or other types of insurance.   PLAN: After considering the risks, benefits, and limitations, Ms. Mumaw provided informed consent to pursue genetic testing and the blood sample was sent to [facility]  for analysis of the Multi-Cancer Panel. Results should be available within approximately 2-3 weeks' time, at which point they will be disclosed by telephone to Ms. Marquart, as will any additional recommendations warranted by these results. Ms. Hinojosa will receive a summary of her genetic counseling visit and a copy of her results once available. This information will also be available in Epic.   Ms. Crotwell questions were answered to her satisfaction today. Our contact information was provided should additional questions or concerns arise. Thank you for the referral and allowing [place] to share in the care of your patient.   Faith Rogue, MS, [facility] Genetic Counselor [email or web address] Phone: [phone]  The patient was seen for a total of 30 minutes in face-to-face genetic counseling. [employer] intern Magda Paganini was also present and assisted with this case.  Dr. Grayland Ormond was available for discussion regarding this case.   _______________________________________________________________________ For Office Staff:  Number of people involved in session: 2 Was an Intern/ student involved with case: no

## 2020-04-05 NOTE — Progress Notes (Signed)
Contacted via MyChart   Good morning Molly Lynch, I have seen you mammogram and see you need further imaging.  I am reaching out to tell you to take big breaths and try not to be too nervous.  I have had to do this every year since I started mammograms at age 40 due to denser breast tissue.  So far repeat imaging and any biopsies I have needed have been negative.  So you are not alone and I am here for you if you have any questions.  Have a great day!!

## 2020-04-13 ENCOUNTER — Other Ambulatory Visit: Payer: Self-pay

## 2020-04-13 ENCOUNTER — Ambulatory Visit
Admission: RE | Admit: 2020-04-13 | Discharge: 2020-04-13 | Disposition: A | Discharge status: Home / Self Care | Payer: No Typology Code available for payment source | Source: Ambulatory Visit | Attending: Nurse Practitioner | Admitting: Nurse Practitioner

## 2020-04-13 DIAGNOSIS — N6320 Unspecified lump in the left breast, unspecified quadrant: Secondary | ICD-10-CM | POA: Insufficient documentation

## 2020-04-13 DIAGNOSIS — R928 Other abnormal and inconclusive findings on diagnostic imaging of breast: Secondary | ICD-10-CM | POA: Diagnosis not present

## 2020-04-16 ENCOUNTER — Other Ambulatory Visit: Payer: Self-pay | Admitting: Nurse Practitioner

## 2020-04-16 DIAGNOSIS — N6489 Other specified disorders of breast: Secondary | ICD-10-CM

## 2020-04-16 DIAGNOSIS — R928 Other abnormal and inconclusive findings on diagnostic imaging of breast: Secondary | ICD-10-CM

## 2020-04-16 NOTE — Progress Notes (Signed)
Orders signed.

## 2020-04-23 ENCOUNTER — Ambulatory Visit: Payer: Self-pay | Admitting: Licensed Clinical Social Worker

## 2020-04-23 ENCOUNTER — Encounter: Payer: Self-pay | Admitting: Licensed Clinical Social Worker

## 2020-04-23 ENCOUNTER — Telehealth: Payer: Self-pay | Admitting: Licensed Clinical Social Worker

## 2020-04-23 DIAGNOSIS — Z1379 Encounter for other screening for genetic and chromosomal anomalies: Secondary | ICD-10-CM | POA: Insufficient documentation

## 2020-04-23 DIAGNOSIS — Z803 Family history of malignant neoplasm of breast: Secondary | ICD-10-CM

## 2020-04-23 DIAGNOSIS — Z808 Family history of malignant neoplasm of other organs or systems: Secondary | ICD-10-CM

## 2020-04-23 NOTE — Telephone Encounter (Signed)
Revealed negative genetic testing.  Revealed that a VUS in RAD50 was identified. This normal result is reassuring.  It is unlikely that there is an increased risk of cancer due to a mutation in one of these genes.  However, genetic testing is not perfect, and cannot definitively rule out a hereditary cause.  It will be important for her to keep in contact with genetics to learn if any additional testing may be needed in the future.      

## 2020-04-23 NOTE — Progress Notes (Signed)
HPI:  Molly Lynch was previously seen in the [place] clinic due to a family history of cancer and concerns regarding a hereditary predisposition to cancer. Please refer to our prior cancer genetics clinic note for more information regarding our discussion, assessment and recommendations, at the time. Molly Lynch recent genetic test results were disclosed to her, as were recommendations warranted by these results. These results and recommendations are discussed in more detail below.  CANCER HISTORY:  Oncology History   No history exists.    FAMILY HISTORY:  We obtained a detailed, 4-generation family history.  Significant diagnoses are listed below: Family History  Problem Relation Age of Onset  . Alcohol abuse Mother   . Mental illness Mother   . Thyroid disease Mother   . Cancer Maternal Grandmother 46       breast  . Diabetes Maternal Grandmother   . Heart disease Maternal Grandmother        Multiple MIs and CABG  . Hip fracture Maternal Grandmother   . Breast cancer Maternal Grandmother   . Hypertension Maternal Grandfather   . Colon polyps Maternal Grandfather 40  . Diabetes Paternal Grandmother   . Cancer Maternal Aunt        Thyroid gland  . Breast cancer Maternal Aunt   . Clotting disorder Father        Factor II Prothrombin Gene Mutation  . Clotting disorder Paternal Uncle        Factor II Prothrombin Gene Mutation    Molly Lynch has 1 son. She has a maternal half sister and paternal half brother, no cancers.   Molly Lynch's mother is living at age 50, no cancers. She had 3 sisters and 1 brother. One of her sisters had breast cancer at 31 and thyroid at 36, passed at 69. Patient's maternal grandmother had breast cancer at 31 and died at 67. Her mother had face/sinus cancer, and her mother (patient's great-great grandmother) had breast cancer. Patient's maternal grandfather had prostate cancer and is living at 55. No other known cancers on this  side of the family.  Molly Lynch's father is living at 54. No known cancers on this side of the family aside from a cousin who passed from metastatic skin cancer in his 85s.   Molly Lynch is unaware of previous family history of genetic testing for hereditary cancer risks. Patient's maternal ancestors are of French/Irish descent, and paternal ancestors are of unknown descent. There is no reported Ashkenazi Jewish ancestry. There is no known consanguinity.     GENETIC TEST RESULTS: Genetic testing reported out on 04/23/2020 through the [facility] Multi- cancer panel + RNA found no pathogenic mutations.   The Multi-Cancer Panel + RNA offered by [facility] includes sequencing and/or deletion duplication testing of the following 84 genes: AIP, ALK, APC, ATM, AXIN2,BAP1,  BARD1, BLM, BMPR1A, BRCA1, BRCA2, BRIP1, CASR, CDC73, CDH1, CDK4, CDKN1B, CDKN1C, CDKN2A (p14ARF), CDKN2A (p16INK4a), CEBPA, CHEK2, CTNNA1, DICER1, DIS3L2, EGFR (c.2369C>T, p.Thr790Met variant only), EPCAM (Deletion/duplication testing only), FH, FLCN, GATA2, GPC3, GREM1 (Promoter region deletion/duplication testing only), HOXB13 (c.251G>A, p.Gly84Glu), HRAS, KIT, MAX, MEN1, MET, MITF (c.952G>A, p.Glu318Lys variant only), MLH1, MSH2, MSH3, MSH6, MUTYH, NBN, NF1, NF2, NTHL1, PALB2, PDGFRA, PHOX2B, PMS2, POLD1, POLE, POT1, PRKAR1A, PTCH1, PTEN, RAD50, RAD51C, RAD51D, RB1, RECQL4, RET, RUNX1, SDHAF2, SDHA (sequence changes only), SDHB, SDHC, SDHD, SMAD4, SMARCA4, SMARCB1, SMARCE1, STK11, SUFU, TERC, TERT, TMEM127, TP53, TSC1, TSC2, VHL, WRN and WT1.   The test report has been scanned into EPIC and  is located under the Molecular Pathology section of the Results Review tab.  A portion of the result report is included below for reference.     We discussed with Molly Lynch that because current genetic testing is not perfect, it is possible there may be a gene mutation in one of these genes that current testing cannot detect, but that chance is  small.  We also discussed, that there could be another gene that has not yet been discovered, or that we have not yet tested, that is responsible for the cancer diagnoses in the family. It is also possible there is a hereditary cause for the cancer in the family that Molly Lynch did not inherit and therefore was not identified in her testing.  Therefore, it is important to remain in touch with cancer genetics in the future so that we can continue to offer Molly Lynch the most up to date genetic testing.   Genetic testing did identify a variant of uncertain significance (VUS) in the RAD50 gene called c.2502G>C.  At this time, it is unknown if this variant is associated with increased cancer risk or if this is a normal finding, but most variants such as this get reclassified to being inconsequential. It should not be used to make medical management decisions. With time, we suspect the lab will determine the significance of this variant, if any. If we do learn more about it, we will try to contact Molly Lynch to discuss it further. However, it is important to stay in touch with [place] periodically and keep the address and phone number up to date.  ADDITIONAL GENETIC TESTING: We discussed with Molly Lynch that her genetic testing was fairly extensive.  If there are genes identified to increase cancer risk that can be analyzed in the future, we would be happy to discuss and coordinate this testing at that time.    CANCER SCREENING RECOMMENDATIONS: Molly Lynch test result is considered negative (normal).  This means that we have not identified a hereditary cause for her family history of cancer at this time.   While reassuring, this does not definitively rule out a hereditary predisposition to cancer. It is still possible that there could be genetic mutations that are undetectable by current technology. There could be genetic mutations in genes that have not been tested or identified to increase cancer risk.   Therefore, it is recommended she continue to follow the cancer management and screening guidelines provided by her  primary healthcare provider.   An individual's cancer risk and medical management are not determined by genetic test results alone. Overall cancer risk assessment incorporates additional factors, including personal medical history, family history, and any available genetic information that may result in a personalized plan for cancer prevention and surveillance.  Based on Molly Lynch's personal and family history of cancer as well as her genetic test results, risk model Harriett Rush was used to estimate her risk of developing breast cancer. This estimates her lifetime risk of developing breast cancer to be approximately 9.5%.  The patient's lifetime breast cancer risk is a preliminary estimate based on available information using one of several models endorsed by the [place] (ACS). The ACS recommends consideration of breast MRI screening as an adjunct to mammography for patients at high risk (defined as 20% or greater lifetime risk).     RECOMMENDATIONS FOR FAMILY MEMBERS:  Relatives in this family might be at some increased risk of developing cancer, over the general population risk, simply due  to the family history of cancer.  We recommended female relatives in this family have a yearly mammogram beginning at age 40, or 65 years younger than the earliest onset of cancer, an annual clinical breast exam, and perform monthly breast self-exams. Female relatives in this family should also have a gynecological exam as recommended by their primary provider.  All family members should be referred for colonoscopy starting at age 29.    It is also possible there is a hereditary cause for the cancer in Molly Lynch family that she did not inherit and therefore was not identified in her.  Based on Molly Lynch family history, we recommended maternal relatives have genetic  counseling and testing. Molly Lynch will let us know if we can be of any assistance in coordinating genetic counseling and/or testing for these family members.  FOLLOW-UP: Lastly, we discussed with Ms. Berrian that cancer genetics is a rapidly advancing field and it is possible that new genetic tests will be appropriate for her and/or her family members in the future. We encouraged her to remain in contact with cancer genetics on an annual basis so we can update her personal and family histories and let her know of advances in cancer genetics that may benefit this family.   Our contact number was provided. Ms. Barlowe questions were answered to her satisfaction, and she knows she is welcome to call us at anytime with additional questions or concerns.   Faith Rogue, MS, [facility] Genetic Counselor [email or web address] Phone: [phone]

## 2020-04-24 ENCOUNTER — Ambulatory Visit: Admission: RE | Admit: 2020-04-24 | Payer: No Typology Code available for payment source | Source: Ambulatory Visit

## 2020-04-24 ENCOUNTER — Other Ambulatory Visit: Payer: Self-pay | Admitting: Nurse Practitioner

## 2020-04-24 ENCOUNTER — Ambulatory Visit
Admission: RE | Admit: 2020-04-24 | Discharge: 2020-04-24 | Disposition: A | Discharge status: Home / Self Care | Payer: No Typology Code available for payment source | Source: Ambulatory Visit | Attending: Nurse Practitioner | Admitting: Nurse Practitioner

## 2020-04-24 ENCOUNTER — Other Ambulatory Visit: Payer: Self-pay

## 2020-04-24 DIAGNOSIS — N6489 Other specified disorders of breast: Secondary | ICD-10-CM | POA: Diagnosis present

## 2020-04-24 DIAGNOSIS — R928 Other abnormal and inconclusive findings on diagnostic imaging of breast: Secondary | ICD-10-CM

## 2020-04-26 ENCOUNTER — Ambulatory Visit: Payer: No Typology Code available for payment source

## 2020-05-01 ENCOUNTER — Encounter: Payer: Self-pay | Admitting: Nurse Practitioner

## 2020-05-01 ENCOUNTER — Other Ambulatory Visit: Payer: Self-pay

## 2020-05-01 ENCOUNTER — Telehealth: Payer: 59 | Admitting: Nurse Practitioner

## 2020-05-01 VITALS — Temp 100.3°F

## 2020-05-01 DIAGNOSIS — J0100 Acute maxillary sinusitis, unspecified: Secondary | ICD-10-CM | POA: Diagnosis not present

## 2020-05-01 MED ORDER — HYDROCOD POLST-CPM POLST ER 10-8 MG/5ML PO SUER: 5.0000 mL | Freq: Two times a day (BID) | ORAL | 0 refills | Status: DC | PRN

## 2020-05-01 MED ORDER — PREDNISONE 20 MG PO TABS: 40.0000 mg | ORAL_TABLET | Freq: Every day | ORAL | 0 refills | Status: AC

## 2020-05-01 NOTE — Patient Instructions (Signed)

## 2020-05-01 NOTE — Assessment & Plan Note (Signed)
Acute x 3 days, negative Covid testing and is vaccinated.  Discussed with her at this time will not provide abx as suspect more viral and allergy related, if no improvement by Sunday (7 days) to alert provider and will send this in.  Discussed abx stewardship with her.  At this time treat with Prednisone 40 MG daily x 5 days and Tussionex sent in for symptom relief at HS.  Recommend she continue daily antihistamine and at home treatment.  Recommend: - Increased rest - Increasing Fluids - Acetaminophen / ibuprofen as needed for fever/pain.  - Saline sinus flushes or a neti pot.  - Humidifying the air Return for worsening or ongoing symptoms.

## 2020-05-01 NOTE — Progress Notes (Signed)
Temp 100.3 F (37.9 C) (Oral)    Subjective:    Patient ID: Molly Lynch, female    DOB: 11/17/1980, 40 y.o.   MRN: [id number]  HPI: Molly Lynch is a 40 y.o. female  Chief Complaint  Patient presents with  . Nasal Congestion  . Sinus Problem    Patient states she is having sinus pressure on the left side and states she has tried OTC medications. Patient states it was so much pressure in her face, that it felt she could have dented her face in. Patient states her symptoms started Sunday, as she has been dealing with allergies for over a month now. Patient states there is discoloration of a yellow mucus drainage. At-home test was negative.   . Fever    Patient states she has felt feverish and it started yesterday and it was a low grade fever and then today it was 100.3    . This visit was completed via MyChart due to the restrictions of the COVID-19 pandemic. All issues as above were discussed and addressed. Physical exam was done as above through visual confirmation on MyChart. If it was felt that the patient should be evaluated in the office, they were directed there. The patient verbally consented to this visit. . Location of the patient: home . Location of the provider: work . Those involved with this call:  . Provider: Aura Dials, DNP . CMA: Malen Gauze, CMA . Front Desk/Registration: Harriet Pho  . Time spent on call: 21 minutes with patient face to face via video conference. More than 50% of this time was spent in counseling and coordination of care. 15 minutes total spent in review of patient's record and preparation of their chart.  . I verified patient identity using two factors (patient name and date of birth). Patient consents verbally to being seen via telemedicine visit today.    UPPER RESPIRATORY TRACT INFECTION Started with symptoms on Sunday -- pressure to sinuses.  Is Covid vaccinated x 3.  Yesterday morning woke-up with eyes glued shut and  pressure + headache + dripping nose.  Covid testing negative at home today.  Has underlying allergic rhinitis -- takes Claritin for allergies.  Used Flonase x 1 and she immediately vomited.   Fever: yes Cough: yes Shortness of breath: no Wheezing: no Chest pain: no Chest tightness: no Chest congestion: no Nasal congestion: yes Runny nose: yes Post nasal drip: yes Sneezing: yes Sore throat: no Swollen glands: no Sinus pressure: yes Headache: yes Face pain: yes Toothache: yes Ear pain: a little bit Ear pressure: yes bilateral Eyes red/itching:no Eye drainage/crusting: yes  Vomiting: no Rash: no Fatigue: yes Sick contacts: over weekend two children were sick Strep contacts: no  Context: fluctuating Recurrent sinusitis: no Relief with OTC cold/cough medications: yes  Treatments attempted: cold/sinus   Relevant past medical, surgical, family and social history reviewed and updated as indicated. Interim medical history since our last visit reviewed. Allergies and medications reviewed and updated.  Review of Systems  Constitutional: Positive for fatigue and fever. Negative for activity change and appetite change.  HENT: Positive for congestion, postnasal drip, rhinorrhea, sinus pressure and sinus pain. Negative for ear discharge, ear pain, facial swelling, sneezing, sore throat and voice change.   Eyes: Negative for pain and visual disturbance.  Respiratory: Positive for cough and chest tightness. Negative for shortness of breath and wheezing.   Cardiovascular: Negative for chest pain, palpitations and leg swelling.  Gastrointestinal: Negative for abdominal distention, abdominal  pain, constipation, diarrhea, nausea and vomiting.  Endocrine: Negative.   Musculoskeletal: Positive for myalgias.  Neurological: Positive for headaches. Negative for dizziness and numbness.  Psychiatric/Behavioral: Negative.     Per HPI unless specifically indicated above     Objective:    Temp  100.3 F (37.9 C) (Oral)   Wt Readings from Last 3 Encounters:  03/23/20 185 lb 9.6 oz (84.2 kg)  03/19/20 187 lb 3.2 oz (84.9 kg)  01/23/20 185 lb 4 oz (84 kg)    Physical Exam Vitals and nursing note reviewed.  Constitutional:      General: She is awake. She is not in acute distress.    Appearance: She is well-developed. She is ill-appearing. She is not toxic-appearing.  HENT:     Head: Normocephalic.     Right Ear: Hearing normal.     Left Ear: Hearing normal.     Nose:     Right Sinus: Maxillary sinus tenderness present. No frontal sinus tenderness.     Left Sinus: Maxillary sinus tenderness present. No frontal sinus tenderness.     Comments: Reports discomfort to maxillary area. Eyes:     General: Lids are normal.        Right eye: No discharge.        Left eye: No discharge.     Conjunctiva/sclera: Conjunctivae normal.  Pulmonary:     Effort: Pulmonary effort is normal. No accessory muscle usage or respiratory distress.  Musculoskeletal:     Cervical back: Normal range of motion.  Neurological:     Mental Status: She is alert and oriented to person, place, and time.  Psychiatric:        Attention and Perception: Attention normal.        Mood and Affect: Mood normal.        Behavior: Behavior normal. Behavior is cooperative.        Thought Content: Thought content normal.        Judgment: Judgment normal.     Results for orders placed or performed in visit on 03/23/20  Prothrombin gene mutation  Result Value Ref Range   Recommendations-PTGENE: Comment (A)       Assessment & Plan:   Problem List Items Addressed This Visit      Respiratory   Acute non-recurrent maxillary sinusitis - Primary    Acute x 3 days, negative Covid testing and is vaccinated.  Discussed with her at this time will not provide abx as suspect more viral and allergy related, if no improvement by Sunday (7 days) to alert provider and will send this in.  Discussed abx stewardship with her.  At  this time treat with Prednisone 40 MG daily x 5 days and Tussionex sent in for symptom relief at HS.  Recommend she continue daily antihistamine and at home treatment.  Recommend: - Increased rest - Increasing Fluids - Acetaminophen / ibuprofen as needed for fever/pain.  - Saline sinus flushes or a neti pot.  - Humidifying the air Return for worsening or ongoing symptoms.      Relevant Medications   loratadine (CLARITIN) 10 MG tablet   predniSONE (DELTASONE) 20 MG tablet   chlorpheniramine-HYDROcodone (TUSSIONEX PENNKINETIC ER) 10-8 MG/5ML SUER      I discussed the assessment and treatment plan with the patient. The patient was provided an opportunity to ask questions and all were answered. The patient agreed with the plan and demonstrated an understanding of the instructions.   The patient was advised to call back  or seek an in-person evaluation if the symptoms worsen or if the condition fails to improve as anticipated.   I provided 21+ minutes of time during this encounter.  Follow up plan: Return if symptoms worsen or fail to improve.

## 2020-05-25 ENCOUNTER — Ambulatory Visit: Payer: 59 | Admitting: Nurse Practitioner

## 2020-05-25 ENCOUNTER — Ambulatory Visit
Admission: RE | Admit: 2020-05-25 | Discharge: 2020-05-25 | Disposition: A | Discharge status: Home / Self Care | Payer: 59 | Source: Ambulatory Visit | Attending: Nurse Practitioner | Admitting: Nurse Practitioner

## 2020-05-25 ENCOUNTER — Other Ambulatory Visit: Payer: Self-pay

## 2020-05-25 ENCOUNTER — Encounter: Payer: Self-pay | Admitting: Nurse Practitioner

## 2020-05-25 VITALS — BP 115/75 | HR 71 | Temp 98.6°F | Wt 193.4 lb

## 2020-05-25 DIAGNOSIS — R3129 Other microscopic hematuria: Secondary | ICD-10-CM | POA: Diagnosis not present

## 2020-05-25 DIAGNOSIS — M545 Low back pain, unspecified: Secondary | ICD-10-CM | POA: Insufficient documentation

## 2020-05-25 LAB — URINALYSIS, ROUTINE W REFLEX MICROSCOPIC
Bilirubin, UA: NEGATIVE
Glucose, UA: NEGATIVE
Ketones, UA: NEGATIVE
Leukocytes,UA: NEGATIVE
Nitrite, UA: NEGATIVE
Protein,UA: NEGATIVE
Specific Gravity, UA: 1.01 (ref 1.005–1.030)
Urobilinogen, Ur: 0.2 mg/dL (ref 0.2–1.0)
pH, UA: 7.5 (ref 5.0–7.5)

## 2020-05-25 LAB — MICROSCOPIC EXAMINATION

## 2020-05-25 LAB — WET PREP FOR TRICH, YEAST, CLUE
Clue Cell Exam: NEGATIVE
Trichomonas Exam: NEGATIVE
Yeast Exam: NEGATIVE

## 2020-05-25 MED ORDER — KETOROLAC TROMETHAMINE 60 MG/2ML IM SOLN
60.0000 mg | Freq: Once | INTRAMUSCULAR | Status: AC
  Administered 2020-05-25: 60 mg via INTRAMUSCULAR

## 2020-05-25 MED ORDER — TAMSULOSIN HCL 0.4 MG PO CAPS: 0.4000 mg | ORAL_CAPSULE | Freq: Every day | ORAL | 3 refills | Status: DC

## 2020-05-25 MED ORDER — CYCLOBENZAPRINE HCL 10 MG PO TABS: 10.0000 mg | ORAL_TABLET | Freq: Three times a day (TID) | ORAL | 0 refills | Status: DC | PRN

## 2020-05-25 NOTE — Patient Instructions (Signed)
Flank Pain, Adult Flank pain is pain in your side. The flank is the area of your side between your upper belly (abdomen) and your back. The pain may occur over a short time (acute), or it may be long-term or come back often (chronic). It may be mild or very bad. Pain in this area can be caused by many different things. Follow these instructions at home:  Drink enough fluid to keep your pee (urine) clear or pale yellow.  Rest as told by your doctor.  Take over-the-counter and prescription medicines only as told by your doctor.  Keep a journal to keep track of: ? What has caused your flank pain. ? What has made it feel better.  Keep all follow-up visits as told by your doctor. This is important.   Contact a doctor if:  Medicine does not help your pain.  You have new symptoms.  Your pain gets worse.  You have a fever.  Your symptoms last longer than 2-3 days.  You have trouble peeing.  You are peeing more often than normal. Get help right away if:  You have trouble breathing.  You are short of breath.  Your belly hurts, or it is swollen or red.  You feel sick to your stomach (nauseous).  You throw up (vomit).  You feel like you will pass out, or you do pass out (faint).  You have blood in your pee. Summary  Flank pain is pain in your side. The flank is the area of your side between your upper belly (abdomen) and your back.  Flank pain may occur over a short time (acute), or it may be long-term or come back often (chronic). It may be mild or very bad.  Pain in this area can be caused by many different things.  Contact your doctor if your symptoms get worse or they last longer than 2-3 days. This information is not intended to replace advice given to you by your health care provider. Make sure you discuss any questions you have with your health care provider. Document Revised: 10/07/2019 Document Reviewed: 10/07/2019 Elsevier Patient Education  2021 [employer]  [employer].  

## 2020-05-25 NOTE — Assessment & Plan Note (Signed)
Acute with waxing and waning x 2 weeks, history of kidney stones.  Current UA negative with exception trace blood.  Wet prep negative.  Concern for kidney stone with symptoms and trace blood.  Start Flomax QHS and Flexeril as needed.  Toradol shot in office today for discomfort.  Obtain STAT CT abdomen/pelvis.  Discussed at length with patient.  If kidney stone present will plan urology referral dependent on status.  CBC and CMP today.  Strainer provider for stone.  Return in one week.  If worsening return sooner or immediately go to ER.

## 2020-05-25 NOTE — Assessment & Plan Note (Signed)
Refer to lower back pain plan of care.

## 2020-05-25 NOTE — Progress Notes (Signed)
Contacted via MyChart   Good afternoon Molly Lynch, no stones noted.  Great news.  Does not mean you did not have one, it could have passed already.  Especially with the trace of blood in urine.  I would recommend taking Flexeril as needed.  Take the Flomax for 7 days to open up everything to be on safe side.  Keep follow-up for 06/01/20 with Lauren.  If any worsening pain, let me know right away.:)

## 2020-05-25 NOTE — Progress Notes (Signed)
BP 115/75   Pulse 71   Temp 98.6 F (37 C) (Oral)   Wt 193 lb 6.4 oz (87.7 kg)   SpO2 98%   BMI 34.81 kg/m    Subjective:    Patient ID: Molly Lynch, female    DOB: 10/29/1980, 40 y.o.   MRN: [id number]  HPI: Molly Lynch is a 40 y.o. female  Chief Complaint  Patient presents with  . Back Pain    Patient states for the past couple of weeks she has been having an issues with lower back pain and states she has drank cranberry juice and water and she states this morning it has gotten worse. Patient denies having any UTI symptoms. Patient states she think it may be a kidney stone prior and states the pain of this feels about the same.    BACK PAIN Started two weeks ago, off and on pain.  To middle low back and radiates around into right hip and down.  Has a history of kidney stone 22 years ago when pregnant and this feels similar.  Has increased cranberry juice and water at home.  Yesterday pain did not hurt at all and then today it is worse, at times bringing tears to eyes.   Duration: weeks Mechanism of injury: unknown Location: midline and low back Onset: sudden Severity: 10/10 at worst, at present 8/10 Quality: dull and aching -- walking and stepping makes sharper Frequency: intermittent Radiation: none Aggravating factors: lifting and walking Alleviating factors: laying, NSAIDs and APAP Status: fluctuating Treatments attempted: rest, heat, APAP and ibuprofen  Relief with NSAIDs?: mild Nighttime pain:  no Paresthesias / decreased sensation:  no Bowel / bladder incontinence:  no Fevers:  no Dysuria / urinary frequency:  no  Relevant past medical, surgical, family and social history reviewed and updated as indicated. Interim medical history since our last visit reviewed. Allergies and medications reviewed and updated.  Review of Systems  Constitutional: Negative for activity change, appetite change, diaphoresis, fatigue and fever.  Respiratory: Negative for  cough, chest tightness and shortness of breath.   Cardiovascular: Negative for chest pain, palpitations and leg swelling.  Gastrointestinal: Positive for nausea. Negative for abdominal distention, abdominal pain, constipation, diarrhea and vomiting.  Genitourinary: Positive for frequency. Negative for dysuria, hematuria, urgency and vaginal discharge.  Musculoskeletal: Positive for back pain.  Neurological: Negative.   Psychiatric/Behavioral: Negative.     Per HPI unless specifically indicated above     Objective:    BP 115/75   Pulse 71   Temp 98.6 F (37 C) (Oral)   Wt 193 lb 6.4 oz (87.7 kg)   SpO2 98%   BMI 34.81 kg/m   Wt Readings from Last 3 Encounters:  05/25/20 193 lb 6.4 oz (87.7 kg)  03/23/20 185 lb 9.6 oz (84.2 kg)  03/19/20 187 lb 3.2 oz (84.9 kg)    Physical Exam Vitals and nursing note reviewed.  Constitutional:      General: She is awake. She is not in acute distress.    Appearance: She is well-developed and well-groomed. She is obese. She is not ill-appearing or toxic-appearing.  HENT:     Head: Normocephalic.     Right Ear: Hearing normal.     Left Ear: Hearing normal.  Eyes:     General: Lids are normal.        Right eye: No discharge.        Left eye: No discharge.     Conjunctiva/sclera: Conjunctivae normal.  Pupils: Pupils are equal, round, and reactive to light.  Neck:     Vascular: No carotid bruit.  Cardiovascular:     Rate and Rhythm: Normal rate and regular rhythm.     Heart sounds: Normal heart sounds. No murmur heard. No gallop.   Pulmonary:     Effort: Pulmonary effort is normal. No accessory muscle usage or respiratory distress.     Breath sounds: Normal breath sounds.  Abdominal:     General: Bowel sounds are normal. There is no distension.     Palpations: Abdomen is soft. There is no hepatomegaly.     Tenderness: There is no abdominal tenderness. There is right CVA tenderness. There is no left CVA tenderness or guarding.   Musculoskeletal:     Cervical back: Normal range of motion and neck supple.     Right lower leg: No edema.     Left lower leg: No edema.  Skin:    General: Skin is warm and dry.  Neurological:     Mental Status: She is alert and oriented to person, place, and time.     Deep Tendon Reflexes: Reflexes are normal and symmetric.     Reflex Scores:      Brachioradialis reflexes are 2+ on the right side and 2+ on the left side.      Patellar reflexes are 2+ on the right side and 2+ on the left side. Psychiatric:        Attention and Perception: Attention normal.        Mood and Affect: Mood normal.        Speech: Speech normal.        Behavior: Behavior normal. Behavior is cooperative.        Thought Content: Thought content normal.     Results for orders placed or performed in visit on 03/23/20  Prothrombin gene mutation  Result Value Ref Range   Recommendations-PTGENE: Comment (A)       Assessment & Plan:   Problem List Items Addressed This Visit      Genitourinary   Other microscopic hematuria    Refer to lower back pain plan of care.      Relevant Orders   CT Abdomen Pelvis Wo Contrast     Other   Acute midline low back pain without sciatica - Primary    Acute with waxing and waning x 2 weeks, history of kidney stones.  Current UA negative with exception trace blood.  Wet prep negative.  Concern for kidney stone with symptoms and trace blood.  Start Flomax QHS and Flexeril as needed.  Toradol shot in office today for discomfort.  Obtain STAT CT abdomen/pelvis.  Discussed at length with patient.  If kidney stone present will plan urology referral dependent on status.  CBC and CMP today.  Strainer provider for stone.  Return in one week.  If worsening return sooner or immediately go to ER.      Relevant Medications   cyclobenzaprine (FLEXERIL) 10 MG tablet   ketorolac (TORADOL) injection 60 mg (Start on 05/25/2020  3:45 PM)   Other Relevant Orders   Urinalysis, Routine w  reflex microscopic   WET PREP FOR TRICH, YEAST, CLUE   CT Abdomen Pelvis Wo Contrast   CBC with Differential/Platelet   Comprehensive metabolic panel       Follow up plan: Return in about 1 week (around 06/01/2020) for Hematuria with flank pain.

## 2020-05-26 LAB — CBC WITH DIFFERENTIAL/PLATELET
Basophils Absolute: 0.1 10*3/uL (ref 0.0–0.2)
Basos: 1 %
EOS (ABSOLUTE): 0.1 10*3/uL (ref 0.0–0.4)
Eos: 1 %
Hematocrit: 41.3 % (ref 34.0–46.6)
Hemoglobin: 13.9 g/dL (ref 11.1–15.9)
Immature Grans (Abs): 0 10*3/uL (ref 0.0–0.1)
Immature Granulocytes: 0 %
Lymphocytes Absolute: 2.2 10*3/uL (ref 0.7–3.1)
Lymphs: 23 %
MCH: 30.7 pg (ref 26.6–33.0)
MCHC: 33.7 g/dL (ref 31.5–35.7)
MCV: 91 fL (ref 79–97)
Monocytes Absolute: 0.8 10*3/uL (ref 0.1–0.9)
Monocytes: 8 %
Neutrophils Absolute: 6.5 10*3/uL (ref 1.4–7.0)
Neutrophils: 67 %
Platelets: 413 10*3/uL (ref 150–450)
RBC: 4.53 x10E6/uL (ref 3.77–5.28)
RDW: 11.8 % (ref 11.7–15.4)
WBC: 9.7 10*3/uL (ref 3.4–10.8)

## 2020-05-26 LAB — COMPREHENSIVE METABOLIC PANEL
ALT: 24 IU/L (ref 0–32)
AST: 20 IU/L (ref 0–40)
Albumin/Globulin Ratio: 1.7 (ref 1.2–2.2)
Albumin: 4.7 g/dL (ref 3.8–4.8)
Alkaline Phosphatase: 72 IU/L (ref 44–121)
BUN/Creatinine Ratio: 14 (ref 9–23)
BUN: 12 mg/dL (ref 6–24)
Bilirubin Total: 0.4 mg/dL (ref 0.0–1.2)
CO2: 20 mmol/L (ref 20–29)
Calcium: 9.5 mg/dL (ref 8.7–10.2)
Chloride: 102 mmol/L (ref 96–106)
Creatinine, Ser: 0.85 mg/dL (ref 0.57–1.00)
Globulin, Total: 2.8 g/dL (ref 1.5–4.5)
Glucose: 72 mg/dL (ref 65–99)
Potassium: 4.8 mmol/L (ref 3.5–5.2)
Sodium: 139 mmol/L (ref 134–144)
Total Protein: 7.5 g/dL (ref 6.0–8.5)
eGFR: 89 mL/min/{1.73_m2} (ref >59)

## 2020-05-26 NOTE — Progress Notes (Signed)
Contacted via MyChart   Good morning Molly Lynch, your labs have returned and CBC shows no anemia or signs of infection.  Kidney function, creatinine and eGFR, are normal.  Liver function, AST and ALT, is also normal.  Electrolytes all look great.  Did you get my message about your imaging?  At this time how are you feeling?  As I mentioned since pain has been present a couple weeks and you have a trace blood in urine, it could be you passed a kidney stone already and are having lingering pain.  Continue current medications and we will see you next week:) Keep being awesome!!  Thank you for allowing me to participate in your care. Kindest regards, Alease Fait

## 2020-06-01 ENCOUNTER — Other Ambulatory Visit: Payer: Self-pay

## 2020-06-01 ENCOUNTER — Ambulatory Visit: Payer: 59 | Admitting: Nurse Practitioner

## 2020-06-01 ENCOUNTER — Encounter: Payer: Self-pay | Admitting: Nurse Practitioner

## 2020-06-01 VITALS — BP 127/82 | HR 94 | Temp 98.8°F | Wt 193.2 lb

## 2020-06-01 DIAGNOSIS — M545 Low back pain, unspecified: Secondary | ICD-10-CM | POA: Diagnosis not present

## 2020-06-01 DIAGNOSIS — R3129 Other microscopic hematuria: Secondary | ICD-10-CM | POA: Diagnosis not present

## 2020-06-01 NOTE — Assessment & Plan Note (Signed)
Acute, still with intermittent pain. Continue alternating tylenol and ibuprofen as needed for pain. Can use voltaren gel as well. Stretches given for back pain. Follow-up if symptoms worsen or don't improve.

## 2020-06-01 NOTE — Assessment & Plan Note (Signed)
U/A rechecked today and shows 1+ blood. York Spaniel that this is the time of the month that she spots during her menstrual cycle. With her IUD it is sporadic but tends to happen at the beginning of the month. She declines a referral to urology at this time. Will check at next visit, if still having microscopic hematuria, will refer to urology.

## 2020-06-01 NOTE — Progress Notes (Signed)
Established Patient Office Visit  Subjective:  Patient ID: Molly Lynch, female    DOB: 1980-02-06  Age: 40 y.o. MRN: [id number]  CC:  Chief Complaint  Patient presents with  . Hematuria    1 week f/up- pt states she has still been having some on and off pain.    HPI [name] presents for follow-up on her back pain and hematuria. She was seen last week for back pain and was noted to have trace blood in her urine. CT scan was negative, however could have had a kidney stone pass as she has a history of kidney stones. Today her pain is a lot better. Over the past week, she has days where she doesn't have pain, and then other days where she does have pain. She doesn't know of any aggravating factors, as the pain seems to occur randomly. Ibuprofen and tylenol help with pain. Describes it as a dull ache that starts on her right lower back and radiates around to her right hip. Denies any fevers, rashes, constipation, changes in bowel movements.      Past Medical History:  Diagnosis Date  . Anxiety   . Family history of breast cancer   . Family history of thyroid cancer   . Kidney stones   . Migraine   . Oral herpes   . Pyelonephritis     Past Surgical History:  Procedure Laterality Date  . NO PAST SURGERIES      Family History  Problem Relation Age of Onset  . Alcohol abuse Mother   . Mental illness Mother   . Thyroid disease Mother   . Cancer Maternal Grandmother 46       breast  . Diabetes Maternal Grandmother   . Heart disease Maternal Grandmother        Multiple MIs and CABG  . Hip fracture Maternal Grandmother   . Breast cancer Maternal Grandmother   . Hypertension Maternal Grandfather   . Colon polyps Maternal Grandfather 69  . Diabetes Paternal Grandmother   . Cancer Maternal Aunt        Thyroid gland  . Breast cancer Maternal Aunt   . Clotting disorder Father        Factor II Prothrombin Gene Mutation  . Clotting disorder Paternal Uncle         Factor II Prothrombin Gene Mutation    Social History   Socioeconomic History  . Marital status: Married    Spouse name: Not on file  . Number of children: Not on file  . Years of education: Not on file  . Highest education level: Not on file  Occupational History  . Not on file  Tobacco Use  . Smoking status: Former Smoker    Packs/day: 0.25    Years: 25.00    Pack years: 6.25    Types: Cigarettes    Quit date: 03/24/2019    Years since quitting: 1.1  . Smokeless tobacco: Never Used  . Tobacco comment: smoked off and on since the age of 49  Vaping Use  . Vaping Use: Some days  . Substances: Nicotine  Substance and Sexual Activity  . Alcohol use: Yes    Comment: socially  . Drug use: No  . Sexual activity: Yes    Birth control/protection: I.U.D.  Other Topics Concern  . Not on file  Social History Narrative  . Not on file   Social Determinants of Health   Financial Resource Strain: Not on file  Food Insecurity: Not on file  Transportation Needs: Not on file  Physical Activity: Not on file  Stress: Not on file  Social Connections: Not on file  Intimate Partner Violence: Not on file    Outpatient Medications Prior to Visit  Medication Sig Dispense Refill  . cetirizine (ZYRTEC) 10 MG tablet Take 10 mg by mouth daily.    . cyclobenzaprine (FLEXERIL) 10 MG tablet Take 1 tablet (10 mg total) by mouth 3 (three) times daily as needed for muscle spasms. 30 tablet 0  . ipratropium (ATROVENT) 0.06 % nasal spray Place 2 sprays into both nostrils 4 (four) times daily. 15 mL 3  . levonorgestrel (MIRENA) 20 MCG/24HR IUD 1 each by Intrauterine route once.    . Probiotic Product (PROBIOTIC DAILY PO) Take by mouth 2 (two) times daily.    . tamsulosin (FLOMAX) 0.4 MG CAPS capsule Take 1 capsule (0.4 mg total) by mouth daily. 30 capsule 3  . venlafaxine XR (EFFEXOR XR) 75 MG 24 hr capsule Take 1 capsule (75 mg total) by mouth daily with breakfast. 90 capsule 4  . VITAMIN D PO Take  by mouth.     No facility-administered medications prior to visit.    No Known Allergies  ROS Review of Systems  Constitutional: Negative.   Respiratory: Negative.   Cardiovascular: Negative.   Gastrointestinal: Negative.   Genitourinary: Negative.   Musculoskeletal: Positive for back pain.  Neurological: Negative.       Objective:    Physical Exam Vitals and nursing note reviewed.  Constitutional:      General: She is not in acute distress.    Appearance: Normal appearance.  HENT:     Head: Normocephalic and atraumatic.  Eyes:     Conjunctiva/sclera: Conjunctivae normal.  Cardiovascular:     Rate and Rhythm: Normal rate and regular rhythm.     Pulses: Normal pulses.     Heart sounds: Normal heart sounds.  Pulmonary:     Effort: Pulmonary effort is normal.     Breath sounds: Normal breath sounds.  Musculoskeletal:        General: Normal range of motion.     Cervical back: Normal range of motion.     Comments: No tenderness with palpation. Straight leg raise negative bilaterally.  Skin:    General: Skin is warm and dry.  Neurological:     General: No focal deficit present.     Mental Status: She is alert and oriented to person, place, and time.     Motor: No weakness.  Psychiatric:        Mood and Affect: Mood normal.        Behavior: Behavior normal.        Thought Content: Thought content normal.        Judgment: Judgment normal.     BP 127/82   Pulse 94   Temp 98.8 F (37.1 C) (Oral)   Wt 193 lb 3.2 oz (87.6 kg)   SpO2 98%   BMI 34.77 kg/m  Wt Readings from Last 3 Encounters:  06/01/20 193 lb 3.2 oz (87.6 kg)  05/25/20 193 lb 6.4 oz (87.7 kg)  03/23/20 185 lb 9.6 oz (84.2 kg)     Health Maintenance Due  Topic Date Due  . Hepatitis C Screening  Never done    There are no preventive care reminders to display for this patient.  Lab Results  Component Value Date   TSH 1.380 01/23/2020   Lab Results  Component Value  Date   WBC 9.7  05/25/2020   HGB 13.9 05/25/2020   HCT 41.3 05/25/2020   MCV 91 05/25/2020   PLT 413 05/25/2020   Lab Results  Component Value Date   NA 139 05/25/2020   K 4.8 05/25/2020   CO2 20 05/25/2020   GLUCOSE 72 05/25/2020   BUN 12 05/25/2020   CREATININE 0.85 05/25/2020   BILITOT 0.4 05/25/2020   ALKPHOS 72 05/25/2020   AST 20 05/25/2020   ALT 24 05/25/2020   PROT 7.5 05/25/2020   ALBUMIN 4.7 05/25/2020   CALCIUM 9.5 05/25/2020   EGFR 89 05/25/2020   Lab Results  Component Value Date   CHOL 147 01/24/2019   Lab Results  Component Value Date   HDL 70 01/24/2019   Lab Results  Component Value Date   LDLCALC 63 01/24/2019   Lab Results  Component Value Date   TRIG 72 01/24/2019   No results found for: CHOLHDL No results found for: HGBA1C    Assessment & Plan:   Problem List Items Addressed This Visit      Genitourinary   Other microscopic hematuria - Primary    U/A rechecked today and shows 1+ blood. Michela Pitcher that this is the time of the month that she spots during her menstrual cycle. With her IUD it is sporadic but tends to happen at the beginning of the month. She declines a referral to urology at this time. Will check at next visit, if still having microscopic hematuria, will refer to urology.      Relevant Orders   Urinalysis, Routine w reflex microscopic     Other   Acute right-sided low back pain without sciatica    Acute, still with intermittent pain. Continue alternating tylenol and ibuprofen as needed for pain. Can use voltaren gel as well. Stretches given for back pain. Follow-up if symptoms worsen or don't improve.          No orders of the defined types were placed in this encounter.   Follow-up: Return if symptoms worsen or fail to improve.    Charyl Dancer, NP

## 2020-06-01 NOTE — Patient Instructions (Addendum)
Low Back Sprain or Strain Rehab Ask your health care provider which exercises are safe for you. Do exercises exactly as told by your health care provider and adjust them as directed. It is normal to feel mild stretching, pulling, tightness, or discomfort as you do these exercises. Stop right away if you feel sudden pain or your pain gets worse. Do not begin these exercises until told by your health care provider. Stretching and range-of-motion exercises These exercises warm up your muscles and joints and improve the movement and flexibility of your back. These exercises also help to relieve pain, numbness, and tingling. Lumbar rotation 1. Lie on your back on a firm surface and bend your knees. 2. Straighten your arms out to your sides so each arm forms a 90-degree angle (right angle) with a side of your body. 3. Slowly move (rotate) both of your knees to one side of your body until you feel a stretch in your lower back (lumbar). Try not to let your shoulders lift off the floor. 4. Hold this position for __________ seconds. 5. Tense your abdominal muscles and slowly move your knees back to the starting position. 6. Repeat this exercise on the other side of your body. Repeat __________ times. Complete this exercise __________ times a day.   Single knee to chest 1. Lie on your back on a firm surface with both legs straight. 2. Bend one of your knees. Use your hands to move your knee up toward your chest until you feel a gentle stretch in your lower back and buttock. ? Hold your leg in this position by holding on to the front of your knee. ? Keep your other leg as straight as possible. 3. Hold this position for __________ seconds. 4. Slowly return to the starting position. 5. Repeat with your other leg. Repeat __________ times. Complete this exercise __________ times a day.   Prone extension on elbows 1. Lie on your abdomen on a firm surface (prone position). 2. Prop yourself up on your  elbows. 3. Use your arms to help lift your chest up until you feel a gentle stretch in your abdomen and your lower back. ? This will place some of your body weight on your elbows. If this is uncomfortable, try stacking pillows under your chest. ? Your hips should stay down, against the surface that you are lying on. Keep your hip and back muscles relaxed. 4. Hold this position for __________ seconds. 5. Slowly relax your upper body and return to the starting position. Repeat __________ times. Complete this exercise __________ times a day.   Strengthening exercises These exercises build strength and endurance in your back. Endurance is the ability to use your muscles for a long time, even after they get tired. Pelvic tilt This exercise strengthens the muscles that lie deep in the abdomen. 1. Lie on your back on a firm surface. Bend your knees and keep your feet flat on the floor. 2. Tense your abdominal muscles. Tip your pelvis up toward the ceiling and flatten your lower back into the floor. ? To help with this exercise, you may place a small towel under your lower back and try to push your back into the towel. 3. Hold this position for __________ seconds. 4. Let your muscles relax completely before you repeat this exercise. Repeat __________ times. Complete this exercise __________ times a day. Alternating arm and leg raises 1. Get on your hands and knees on a firm surface. If you are on a hard   floor, you may want to use padding, such as an exercise mat, to cushion your knees. 2. Line up your arms and legs. Your hands should be directly below your shoulders, and your knees should be directly below your hips. 3. Lift your left leg behind you. At the same time, raise your right arm and straighten it in front of you. ? Do not lift your leg higher than your hip. ? Do not lift your arm higher than your shoulder. ? Keep your abdominal and back muscles tight. ? Keep your hips facing the  ground. ? Do not arch your back. ? Keep your balance carefully, and do not hold your breath. 4. Hold this position for __________ seconds. 5. Slowly return to the starting position. 6. Repeat with your right leg and your left arm. Repeat __________ times. Complete this exercise __________ times a day.   Abdominal set with straight leg raise 1. Lie on your back on a firm surface. 2. Bend one of your knees and keep your other leg straight. 3. Tense your abdominal muscles and lift your straight leg up, 4-6 inches (10-15 cm) off the ground. 4. Keep your abdominal muscles tight and hold this position for __________ seconds. ? Do not hold your breath. ? Do not arch your back. Keep it flat against the ground. 5. Keep your abdominal muscles tense as you slowly lower your leg back to the starting position. 6. Repeat with your other leg. Repeat __________ times. Complete this exercise __________ times a day.   Single leg lower with bent knees 1. Lie on your back on a firm surface. 2. Tense your abdominal muscles and lift your feet off the floor, one foot at a time, so your knees and hips are bent in 90-degree angles (right angles). ? Your knees should be over your hips and your lower legs should be parallel to the floor. 3. Keeping your abdominal muscles tense and your knee bent, slowly lower one of your legs so your toe touches the ground. 4. Lift your leg back up to return to the starting position. ? Do not hold your breath. ? Do not let your back arch. Keep your back flat against the ground. 5. Repeat with your other leg. Repeat __________ times. Complete this exercise __________ times a day. Posture and body mechanics Good posture and healthy body mechanics can help to relieve stress in your body's tissues and joints. Body mechanics refers to the movements and positions of your body while you do your daily activities. Posture is part of body mechanics. Good posture means:  Your spine is in  its natural S-curve position (neutral).  Your shoulders are pulled back slightly.  Your head is not tipped forward. Follow these guidelines to improve your posture and body mechanics in your everyday activities. Standing  When standing, keep your spine neutral and your feet about hip width apart. Keep a slight bend in your knees. Your ears, shoulders, and hips should line up.  When you do a task in which you stand in one place for a long time, place one foot up on a stable object that is 2-4 inches (5-10 cm) high, such as a footstool. This helps keep your spine neutral.   Sitting  When sitting, keep your spine neutral and keep your feet flat on the floor. Use a footrest, if necessary, and keep your thighs parallel to the floor. Avoid rounding your shoulders, and avoid tilting your head forward.  When working at a desk or   a computer, keep your desk at a height where your hands are slightly lower than your elbows. Slide your chair under your desk so you are close enough to maintain good posture.  When working at a computer, place your monitor at a height where you are looking straight ahead and you do not have to tilt your head forward or downward to look at the screen.   Resting  When lying down and resting, avoid positions that are most painful for you.  If you have pain with activities such as sitting, bending, stooping, or squatting, lie in a position in which your body does not bend very much. For example, avoid curling up on your side with your arms and knees near your chest (fetal position).  If you have pain with activities such as standing for a long time or reaching with your arms, lie with your spine in a neutral position and bend your knees slightly. Try the following positions: ? Lying on your side with a pillow between your knees. ? Lying on your back with a pillow under your knees. Lifting  When lifting objects, keep your feet at least shoulder width apart and tighten your  abdominal muscles.  Bend your knees and hips and keep your spine neutral. It is important to lift using the strength of your legs, not your back. Do not lock your knees straight out.  Always ask for help to lift heavy or awkward objects.   This information is not intended to replace advice given to you by your health care provider. Make sure you discuss any questions you have with your health care provider. Document Revised: 05/07/2018 Document Reviewed: 02/04/2018 Elsevier Patient Education  2021 [employer]   Back Exercises The following exercises strengthen the muscles that help to support the trunk and back. They also help to keep the lower back flexible. Doing these exercises can help to prevent back pain or lessen existing pain.  If you have back pain or discomfort, try doing these exercises 2-3 times each day or as told by your health care provider.  As your pain improves, do them once each day, but increase the number of times that you repeat the steps for each exercise (do more repetitions).  To prevent the recurrence of back pain, continue to do these exercises once each day or as told by your health care provider. Do exercises exactly as told by your health care provider and adjust them as directed. It is normal to feel mild stretching, pulling, tightness, or discomfort as you do these exercises, but you should stop right away if you feel sudden pain or your pain gets worse. Exercises Single knee to chest Repeat these steps 3-5 times for each leg: 1. Lie on your back on a firm bed or the floor with your legs extended. 2. Bring one knee to your chest. Your other leg should stay extended and in contact with the floor. 3. Hold your knee in place by grabbing your knee or thigh with both hands and hold. 4. Pull on your knee until you feel a gentle stretch in your lower back or buttocks. 5. Hold the stretch for 10-30 seconds. 6. Slowly release and straighten your leg. Pelvic  tilt Repeat these steps 5-10 times: 1. Lie on your back on a firm bed or the floor with your legs extended. 2. Bend your knees so they are pointing toward the ceiling and your feet are flat on the floor. 3. Tighten your lower abdominal muscles  to press your lower back against the floor. This motion will tilt your pelvis so your tailbone points up toward the ceiling instead of pointing to your feet or the floor. 4. With gentle tension and even breathing, hold this position for 5-10 seconds. Cat-cow Repeat these steps until your lower back becomes more flexible: 1. Get into a hands-and-knees position on a firm surface. Keep your hands under your shoulders, and keep your knees under your hips. You may place padding under your knees for comfort. 2. Let your head hang down toward your chest. Contract your abdominal muscles and point your tailbone toward the floor so your lower back becomes rounded like the back of a cat. 3. Hold this position for 5 seconds. 4. Slowly lift your head, let your abdominal muscles relax and point your tailbone up toward the ceiling so your back forms a sagging arch like the back of a cow. 5. Hold this position for 5 seconds.   Press-ups Repeat these steps 5-10 times: 1. Lie on your abdomen (face-down) on the floor. 2. Place your palms near your head, about shoulder-width apart. 3. Keeping your back as relaxed as possible and keeping your hips on the floor, slowly straighten your arms to raise the top half of your body and lift your shoulders. Do not use your back muscles to raise your upper torso. You may adjust the placement of your hands to make yourself more comfortable. 4. Hold this position for 5 seconds while you keep your back relaxed. 5. Slowly return to lying flat on the floor.   Bridges Repeat these steps 10 times: 1. Lie on your back on a firm surface. 2. Bend your knees so they are pointing toward the ceiling and your feet are flat on the floor. Your arms  should be flat at your sides, next to your body. 3. Tighten your buttocks muscles and lift your buttocks off the floor until your waist is at almost the same height as your knees. You should feel the muscles working in your buttocks and the back of your thighs. If you do not feel these muscles, slide your feet 1-2 inches farther away from your buttocks. 4. Hold this position for 3-5 seconds. 5. Slowly lower your hips to the starting position, and allow your buttocks muscles to relax completely. If this exercise is too easy, try doing it with your arms crossed over your chest.   Abdominal crunches Repeat these steps 5-10 times: 1. Lie on your back on a firm bed or the floor with your legs extended. 2. Bend your knees so they are pointing toward the ceiling and your feet are flat on the floor. 3. Cross your arms over your chest. 4. Tip your chin slightly toward your chest without bending your neck. 5. Tighten your abdominal muscles and slowly raise your trunk (torso) high enough to lift your shoulder blades a tiny bit off the floor. Avoid raising your torso higher than that because it can put too much stress on your low back and does not help to strengthen your abdominal muscles. 6. Slowly return to your starting position. Back lifts Repeat these steps 5-10 times: 1. Lie on your abdomen (face-down) with your arms at your sides, and rest your forehead on the floor. 2. Tighten the muscles in your legs and your buttocks. 3. Slowly lift your chest off the floor while you keep your hips pressed to the floor. Keep the back of your head in line with the curve in your  back. Your eyes should be looking at the floor. 4. Hold this position for 3-5 seconds. 5. Slowly return to your starting position. Contact a health care provider if:  Your back pain or discomfort gets much worse when you do an exercise.  Your worsening back pain or discomfort does not lessen within 2 hours after you exercise. If you have  any of these problems, stop doing these exercises right away. Do not do them again unless your health care provider says that you can. Get help right away if:  You develop sudden, severe back pain. If this happens, stop doing the exercises right away. Do not do them again unless your health care provider says that you can. This information is not intended to replace advice given to you by your health care provider. Make sure you discuss any questions you have with your health care provider. Document Revised: 05/20/2018 Document Reviewed: 10/15/2017 Elsevier Patient Education  2021 [employer].

## 2020-06-02 LAB — URINALYSIS, ROUTINE W REFLEX MICROSCOPIC
Bilirubin, UA: NEGATIVE
Glucose, UA: NEGATIVE
Ketones, UA: NEGATIVE
Leukocytes,UA: NEGATIVE
Nitrite, UA: NEGATIVE
Protein,UA: NEGATIVE
Specific Gravity, UA: 1.01 (ref 1.005–1.030)
Urobilinogen, Ur: 0.2 mg/dL (ref 0.2–1.0)
pH, UA: 7 (ref 5.0–7.5)

## 2020-06-02 LAB — MICROSCOPIC EXAMINATION
Bacteria, UA: NONE SEEN
WBC, UA: NONE SEEN /hpf (ref 0–5)

## 2020-06-22 ENCOUNTER — Other Ambulatory Visit: Payer: Self-pay

## 2020-06-22 ENCOUNTER — Ambulatory Visit: Payer: No Typology Code available for payment source | Admitting: Nurse Practitioner

## 2020-06-22 ENCOUNTER — Encounter: Payer: Self-pay | Admitting: Nurse Practitioner

## 2020-06-22 VITALS — BP 121/82 | HR 71 | Temp 98.3°F | Wt 195.4 lb

## 2020-06-22 DIAGNOSIS — Z6835 Body mass index (BMI) 35.0-35.9, adult: Secondary | ICD-10-CM | POA: Diagnosis not present

## 2020-06-22 DIAGNOSIS — E6609 Other obesity due to excess calories: Secondary | ICD-10-CM

## 2020-06-22 DIAGNOSIS — F419 Anxiety disorder, unspecified: Secondary | ICD-10-CM

## 2020-06-22 DIAGNOSIS — R0683 Snoring: Secondary | ICD-10-CM | POA: Diagnosis not present

## 2020-06-22 DIAGNOSIS — G4733 Obstructive sleep apnea (adult) (pediatric): Secondary | ICD-10-CM | POA: Insufficient documentation

## 2020-06-22 MED ORDER — VENLAFAXINE HCL 25 MG PO TABS: 25.0000 mg | ORAL_TABLET | Freq: Every day | ORAL | 0 refills | Status: DC

## 2020-06-22 MED ORDER — CONTRAVE 8-90 MG PO TB12: ORAL_TABLET | ORAL | 12 refills | Status: DC

## 2020-06-22 MED ORDER — VENLAFAXINE HCL 50 MG PO TABS: 50.0000 mg | ORAL_TABLET | Freq: Every day | ORAL | 0 refills | Status: DC

## 2020-06-22 NOTE — Assessment & Plan Note (Addendum)
BMI 35.17.  She would like to work on medication for weight loss as has been trying for 6 weeks without benefit on diet changes and exercise.  Discussed all medication options, she has used Wellbutrin before without issue -- will trial Contrave to start and perform PA if needed.  Could consider Victoza as alternative if needed -- however reports aunt who had thyroid cancer and discussed with her contraindications for this.  Recommended eating smaller high protein, low fat meals more frequently and exercising 30 mins a day 5 times a week with a goal of 10-15lb weight loss in the next 3 months. Patient voiced their understanding and motivation to adhere to these recommendations.  Return in 4 weeks.

## 2020-06-22 NOTE — Assessment & Plan Note (Signed)
Chronic, stable.  Denies SI/HI. Wishes to reduce off medication, discussed at length to reduce this slowly.  Will send in scripts for Effexor 50 MG and 25 MG -- to start with 50 MG tablets and take one daily for 2 weeks, then transition to the 25 MG tablets -- taking one daily for one week and then slowly continue to reduce to every other day then every third day, etc..  If poor tolerance with reduction to alert provider.  Follow-up in 4 weeks.

## 2020-06-22 NOTE — Progress Notes (Signed)
BP 121/82   Pulse 71   Temp 98.3 F (36.8 C) (Oral)   Wt 195 lb 6.4 oz (88.6 kg)   SpO2 99%   BMI 35.17 kg/m    Subjective:    Patient ID: Molly Lynch, female    DOB: 1980-09-20, 40 y.o.   MRN: [id number]  HPI: Molly Lynch is a 40 y.o. female  Chief Complaint  Patient presents with  . Anxiety    Patient states she is here to discuss coming out of current medication for anxiety.   . Weight Check   ANXIETY/STRESSW Currently taking Effexor 75 MG daily.  Would like to come off this as feels she is in better place.   Duration:stable Anxious mood: no  Excessive worrying: no Irritability: no  Sweating: no Nausea: no Palpitations:no Hyperventilation: no Panic attacks: no Agoraphobia: no  Obscessions/compulsions: no Depressed mood: mild Depression screen [facility] 2/9 06/22/2020 01/23/2020 09/08/2019 05/10/2019 03/30/2019  Decreased Interest 0 1 0 0 1  Down, Depressed, Hopeless 0 0 0 0 0  PHQ - 2 Score 0 1 0 0 1  Altered sleeping 2 1 1 1 2   Tired, decreased energy 2 1 1  0 0  Change in appetite 3 1 1  0 1  Feeling bad or failure about yourself  0 1 0 0 0  Trouble concentrating 1 1 2 1 2   Moving slowly or fidgety/restless 0 1 1 0 0  Suicidal thoughts 0 0 0 0 0  PHQ-9 Score 8 7 6 2 6   Difficult doing work/chores Not difficult at all Somewhat difficult Somewhat difficult Not difficult at all Somewhat difficult   Anhedonia: no Weight changes: no Insomnia: yes hard to stay asleep  Hypersomnia: no Fatigue/loss of energy: no Feelings of worthlessness: no Feelings of guilt: no Impaired concentration/indecisiveness: no Suicidal ideations: no  Crying spells: no Recent Stressors/Life Changes: no   Relationship problems: no   Family stress: no     Financial stress: no    Job stress: no    Recent death/loss: no GAD 7 : Generalized Anxiety Score 06/22/2020 01/23/2020 09/08/2019 05/10/2019  Nervous, Anxious, on Edge 0 0 1 1  Control/stop worrying 0 0 0 0  Worry too much -  different things 1 0 0 0  Trouble relaxing 0 1 1 0  Restless 0 0 2 0  Easily annoyed or irritable 0 1 1 1   Afraid - awful might happen 0 0 0 0  Total GAD 7 Score 1 2 5 2   Anxiety Difficulty Not difficult at all Not difficult at all Somewhat difficult Not difficult at all   SNORING Has mouth guard -- feels snoring has become worse, sleeps in separate bed from husband due to snoring. Does endorse waking with headaches at times.  She is interested in weight loss -- has been working at this over past 3 months by cutting back on foods.  Does not like to perform restrictive diets.  Drinking a gallon water a day for 6 weeks.  Has she not lost and has gained weight.  Been exercising some, but does endorse more discomfort with this due to her current weight.  Has been walking and doing some yoga.  Her goal weight is 140ish -- but would be happy with 150-165.  Her lowest weight has been 140ish range in younger adult years.   Wakes feeling refreshed:  no Daytime hypersomnolence:  no Fatigue:  occasional Insomnia:  yes Good sleep hygiene:  yes Difficulty falling asleep:  no  Difficulty staying asleep:  yes Snoring bothers bed partner:  yes Observed apnea by bed partner: no Obesity:  yes Hypertension: no  Pulmonary hypertension:  no Coronary artery disease:  no  Relevant past medical, surgical, family and social history reviewed and updated as indicated. Interim medical history since our last visit reviewed. Allergies and medications reviewed and updated.  Review of Systems  Constitutional: Negative for activity change, appetite change, diaphoresis, fatigue and fever.  Respiratory: Negative for cough, chest tightness, shortness of breath and wheezing.   Cardiovascular: Negative for chest pain, palpitations and leg swelling.  Gastrointestinal: Negative.   Neurological: Negative.   Psychiatric/Behavioral: Negative.     Per HPI unless specifically indicated above     Objective:    BP 121/82    Pulse 71   Temp 98.3 F (36.8 C) (Oral)   Wt 195 lb 6.4 oz (88.6 kg)   SpO2 99%   BMI 35.17 kg/m   Wt Readings from Last 3 Encounters:  06/22/20 195 lb 6.4 oz (88.6 kg)  06/01/20 193 lb 3.2 oz (87.6 kg)  05/25/20 193 lb 6.4 oz (87.7 kg)    Physical Exam Vitals and nursing note reviewed.  Constitutional:      General: She is awake. She is not in acute distress.    Appearance: She is well-developed and well-groomed. She is obese. She is not ill-appearing or toxic-appearing.  HENT:     Head: Normocephalic.     Right Ear: Hearing normal.     Left Ear: Hearing normal.  Eyes:     General: Lids are normal.        Right eye: No discharge.        Left eye: No discharge.     Conjunctiva/sclera: Conjunctivae normal.     Pupils: Pupils are equal, round, and reactive to light.  Neck:     Thyroid: No thyromegaly.     Vascular: No carotid bruit.  Cardiovascular:     Rate and Rhythm: Normal rate and regular rhythm.     Heart sounds: Normal heart sounds. No murmur heard. No gallop.   Pulmonary:     Effort: Pulmonary effort is normal. No accessory muscle usage or respiratory distress.     Breath sounds: Normal breath sounds.  Abdominal:     General: Bowel sounds are normal.     Palpations: Abdomen is soft.  Musculoskeletal:     Cervical back: Normal range of motion and neck supple.     Right lower leg: No edema.     Left lower leg: No edema.  Skin:    General: Skin is warm and dry.  Neurological:     Mental Status: She is alert and oriented to person, place, and time.     Deep Tendon Reflexes: Reflexes are normal and symmetric.     Reflex Scores:      Brachioradialis reflexes are 2+ on the right side and 2+ on the left side.      Patellar reflexes are 2+ on the right side and 2+ on the left side. Psychiatric:        Attention and Perception: Attention normal.        Mood and Affect: Mood normal.        Speech: Speech normal.        Behavior: Behavior normal. Behavior is  cooperative.        Thought Content: Thought content normal.    Results for orders placed or performed in visit on 06/01/20  Microscopic  Examination   Urine  Result Value Ref Range   WBC, UA None seen 0 - 5 /hpf   RBC 0-2 0 - 2 /hpf   Epithelial Cells (non renal) 0-10 0 - 10 /hpf   Bacteria, UA None seen None seen/Few  Urinalysis, Routine w reflex microscopic  Result Value Ref Range   Specific Gravity, UA 1.010 1.005 - 1.030   pH, UA 7.0 5.0 - 7.5   Color, UA Yellow Yellow   Appearance Ur Clear Clear   Leukocytes,UA Negative Negative   Protein,UA Negative Negative/Trace   Glucose, UA Negative Negative   Ketones, UA Negative Negative   RBC, UA 1+ (A) Negative   Bilirubin, UA Negative Negative   Urobilinogen, Ur 0.2 0.2 - 1.0 mg/dL   Nitrite, UA Negative Negative   Microscopic Examination See below:       Assessment & Plan:   Problem List Items Addressed This Visit      Other   Anxiety - Primary    Chronic, stable.  Denies SI/HI. Wishes to reduce off medication, discussed at length to reduce this slowly.  Will send in scripts for Effexor 50 MG and 25 MG -- to start with 50 MG tablets and take one daily for 2 weeks, then transition to the 25 MG tablets -- taking one daily for one week and then slowly continue to reduce to every other day then every third day, etc..  If poor tolerance with reduction to alert provider.  Follow-up in 4 weeks.      Relevant Medications   venlafaxine (EFFEXOR) 50 MG tablet   venlafaxine (EFFEXOR) 25 MG tablet   Obesity    BMI 35.17.  She would like to work on medication for weight loss as has been trying for 6 weeks without benefit on diet changes and exercise.  Discussed all medication options, she has used Wellbutrin before without issue -- will trial Contrave to start and perform PA if needed.  Could consider Victoza as alternative if needed -- however reports aunt who had thyroid cancer and discussed with her contraindications for this.   Recommended eating smaller high protein, low fat meals more frequently and exercising 30 mins a day 5 times a week with a goal of 10-15lb weight loss in the next 3 months. Patient voiced their understanding and motivation to adhere to these recommendations.  Return in 4 weeks.       Relevant Medications   Naltrexone-buPROPion HCl ER (CONTRAVE) 8-90 MG TB12   Snoring    Reported by her partner, no apneic periods noted.  At this time will hold off on sleep testing.  Recommend working on weight loss, which she would prefer.  Refer to obesity plan of care.  Consider sleep study in future if ongoing.          Follow up plan: Return in about 4 weeks (around 07/20/2020) for Anxiety and Weight Management.

## 2020-06-22 NOTE — Assessment & Plan Note (Signed)
Reported by her partner, no apneic periods noted.  At this time will hold off on sleep testing.  Recommend working on weight loss, which she would prefer.  Refer to obesity plan of care.  Consider sleep study in future if ongoing.

## 2020-06-22 NOTE — Patient Instructions (Signed)

## 2020-06-26 ENCOUNTER — Telehealth: Payer: Self-pay

## 2020-06-26 DIAGNOSIS — Z975 Presence of (intrauterine) contraceptive device: Secondary | ICD-10-CM

## 2020-06-26 MED ORDER — CONTRAVE 8-90 MG PO TB12: ORAL_TABLET | ORAL | 12 refills | Status: DC

## 2020-06-26 NOTE — Telephone Encounter (Signed)
-----   Message from Marjie Skiff, NP sent at 06/26/2020 12:26 PM EDT ----- Will need PA for Contrave sent.  For weight loss in patient with BMI 35.17 which has done well with Wellbutrin in past for mood and would benefit from Contrave for weight loss.  She has tried all other methods over past 6 months, including diet changes and exercise without much benefit.

## 2020-06-26 NOTE — Telephone Encounter (Signed)
PA initiated for Contrave via CoverMyMeds

## 2020-06-26 NOTE — Telephone Encounter (Signed)
PA submitted via cover my meds for Contrave. Awaiting approval or denial.  Key: [id number]

## 2020-06-26 NOTE — Addendum Note (Signed)
Addended by: Aura Dials T on: 06/26/2020 12:26 PM   Modules accepted: Orders

## 2020-06-28 ENCOUNTER — Telehealth: Payer: Self-pay

## 2020-06-28 NOTE — Telephone Encounter (Signed)
Spoke with patient specialty pharmacy and unfortunately they do not cover Contrave prescription and states there are not any alternatives medication that are covered. The specialty pharmacy did provide a Appeal contact number [phone] for providers.

## 2020-06-28 NOTE — Telephone Encounter (Signed)
Can you please alert patient, have her reach out to insurance and see what they may cover for weight loss if she wishes to pursue medications.  Or if she would like I can put referral through to Dr. Feliberto Gottron as discussed at visit.

## 2020-06-29 ENCOUNTER — Telehealth: Payer: Self-pay

## 2020-06-29 ENCOUNTER — Other Ambulatory Visit: Payer: Self-pay | Admitting: Nurse Practitioner

## 2020-06-29 NOTE — Telephone Encounter (Signed)
Copied from CRM [id number]. Topic: General - Other >> Jun 29, 2020  9:45 AM Marylen Ponto wrote: Reason for CRM: Pt requested to speak with Destiny. Pt requests call back at [phone]

## 2020-06-29 NOTE — Telephone Encounter (Signed)
Patient was notified of Molly Lynch recommendations and patient verbalized understanding and state she will reach out to her insurance and follow up.

## 2020-06-29 NOTE — Telephone Encounter (Signed)
Responded to patient via MyChart. Patient has sent a MyChart message regarding medication that is covered by insurance states she would like to see what Jolene recommends. Route MyChart message to [place].

## 2020-07-20 ENCOUNTER — Other Ambulatory Visit: Payer: Self-pay

## 2020-07-20 ENCOUNTER — Encounter: Payer: Self-pay | Admitting: Nurse Practitioner

## 2020-07-20 ENCOUNTER — Ambulatory Visit: Payer: No Typology Code available for payment source | Admitting: Nurse Practitioner

## 2020-07-20 DIAGNOSIS — F419 Anxiety disorder, unspecified: Secondary | ICD-10-CM

## 2020-07-20 DIAGNOSIS — E6609 Other obesity due to excess calories: Secondary | ICD-10-CM | POA: Diagnosis not present

## 2020-07-20 DIAGNOSIS — Z6835 Body mass index (BMI) 35.0-35.9, adult: Secondary | ICD-10-CM | POA: Diagnosis not present

## 2020-07-20 NOTE — Assessment & Plan Note (Signed)
Chronic, stable.  Denies SI/HI. Is currently off Effexor and tolerated transition off well, mood remains stable.  Wishes to remain off at this time.  Continue Klonopin only as needed, recommend using this VERY sparingly with goal to stop in future.  UDS and controlled substance contract if continues use.  Return in 6 months, sooner if worsening mood.

## 2020-07-20 NOTE — Patient Instructions (Signed)
Obesity, Adult Obesity is having too much body fat. Being obese means that your weight is morethan what is healthy for you. BMI is a number that explains how much body fat you have. If you have a BMI of 30 or more, you are obese. Obesity is often caused by eating or drinking morecalories than your body uses. Changing your lifestyle can help you lose weight. Obesity can cause serious health problems, such as: Stroke. Coronary artery disease (CAD). Type 2 diabetes. Some types of cancer, including cancers of the colon, breast, uterus, and gallbladder. Osteoarthritis. High blood pressure (hypertension). High cholesterol. Sleep apnea. Gallbladder stones. Infertility problems. What are the causes? Eating meals each day that are high in calories, sugar, and fat. Being born with genes that may make you more likely to become obese. Having a medical condition that causes obesity. Taking certain medicines. Sitting a lot (having a sedentary lifestyle). Not getting enough sleep. Drinking a lot of drinks that have sugar in them. What increases the risk? Having a family history of obesity. Being an African American woman. Being a Hispanic man. Living in an area with limited access to: Parks, recreation centers, or sidewalks. Healthy food choices, such as grocery stores and farmers' markets. What are the signs or symptoms? The main sign is having too much body fat. How is this treated? Treatment for this condition often includes changing your lifestyle. Treatment may include: Changing your diet. This may include making a healthy meal plan. Exercise. This may include activity that causes your heart to beat faster (aerobic exercise) and strength training. Work with your doctor to design a program that works for you. Medicine to help you lose weight. This may be used if you are not able to lose 1 pound a week after 6 weeks of healthy eating and more exercise. Treating conditions that cause the  obesity. Surgery. Options may include gastric banding and gastric bypass. This may be done if: Other treatments have not helped to improve your condition. You have a BMI of 40 or higher. You have life-threatening health problems related to obesity. Follow these instructions at home: Eating and drinking  Follow advice from your doctor about what to eat and drink. Your doctor may tell you to: Limit fast food, sweets, and processed snack foods. Choose low-fat options. For example, choose low-fat milk instead of whole milk. Eat 5 or more servings of fruits or vegetables each day. Eat at home more often. This gives you more control over what you eat. Choose healthy foods when you eat out. Learn to read food labels. This will help you learn how much food is in 1 serving. Keep low-fat snacks available. Avoid drinks that have a lot of sugar in them. These include soda, fruit juice, iced tea with sugar, and flavored milk. Drink enough water to keep your pee (urine) pale yellow. Do not go on fad diets.  Physical activity Exercise often, as told by your doctor. Most adults should get up to 150 minutes of moderate-intensity exercise every week.Ask your doctor: What types of exercise are safe for you. How often you should exercise. Warm up and stretch before being active. Do slow stretching after being active (cool down). Rest between times of being active. Lifestyle Work with your doctor and a food expert (dietitian) to set a weight-loss goal that is best for you. Limit your screen time. Find ways to reward yourself that do not involve food. Do not drink alcohol if: Your doctor tells you not to drink.   You are pregnant, may be pregnant, or are planning to become pregnant. If you drink alcohol: Limit how much you use to: 0-1 drink a day for women. 0-2 drinks a day for men. Be aware of how much alcohol is in your drink. In the [place] one drink equals one 12 oz bottle of beer (355 mL), one 5 oz  glass of wine (148 mL), or one 1 oz glass of hard liquor (44 mL). General instructions Keep a weight-loss journal. This can help you keep track of: The food that you eat. How much exercise you get. Take over-the-counter and prescription medicines only as told by your doctor. Take vitamins and supplements only as told by your doctor. Think about joining a support group. Keep all follow-up visits as told by your doctor. This is important. Contact a doctor if: You cannot meet your weight loss goal after you have changed your diet and lifestyle for 6 weeks. Get help right away if you: Are having trouble breathing. Are having thoughts of harming yourself. Summary Obesity is having too much body fat. Being obese means that your weight is more than what is healthy for you. Work with your doctor to set a weight-loss goal. Get regular exercise as told by your doctor. This information is not intended to replace advice given to you by your health care provider. Make sure you discuss any questions you have with your healthcare provider. Document Revised: 09/17/2017 Document Reviewed: 09/17/2017 [employer]vier Patient Education  2022 [employer]  

## 2020-07-20 NOTE — Progress Notes (Signed)
BP 113/73   Pulse 76   Temp 98.7 F (37.1 C) (Oral)   Wt 195 lb (88.5 kg)   SpO2 99%   BMI 35.10 kg/m    Subjective:    Patient ID: Molly Lynch, female    DOB: 1980-02-05, 40 y.o.   MRN: [id number]  HPI: Molly Lynch is a 40 y.o. female  Chief Complaint  Patient presents with   Weight Management   Anxiety    Patient states she here to discuss coming off the Anxiety medication with provider as she was unable to receive ne prescription. Patient states she has been off her prescription Effexor for about a week and states she has been doing good and states she had to take a 1/2 of Clonazepam due to a stressful discussion at work today.    ANXIETY/STRESS This week she has had no Effexor at all, since Saturday.  Has successfully weaned off this.  Today was only day she had to take Klonopin -- has not taken this in several weeks.  Has had same bottle for months.  Pt is aware of risks of benzo medication use to include increased sedation, respiratory suppression, falls, dependence and cardiovascular events.  Pt would like to continue treatment as benefit determined to outweigh risk.  Last filled August 09/08/19.   Duration:stable Anxious mood: no  Excessive worrying: no Irritability: no  Sweating: no Nausea: no Palpitations:no Hyperventilation: no Panic attacks: no Agoraphobia: no  Obscessions/compulsions: no Depressed mood:  mild Depression screen [facility] 2/9 07/20/2020 06/22/2020 01/23/2020 09/08/2019 05/10/2019  Decreased Interest 0 0 1 0 0  Down, Depressed, Hopeless 0 0 0 0 0  PHQ - 2 Score 0 0 1 0 0  Altered sleeping 1 2 1 1 1   Tired, decreased energy 0 2 1 1  0  Change in appetite 2 3 1 1  0  Feeling bad or failure about yourself  0 0 1 0 0  Trouble concentrating 0 1 1 2 1   Moving slowly or fidgety/restless 0 0 1 1 0  Suicidal thoughts 0 0 0 0 0  PHQ-9 Score 3 8 7 6 2   Difficult doing work/chores Not difficult at all Not difficult at all Somewhat difficult Somewhat  difficult Not difficult at all   Anhedonia: no Weight changes: no Insomnia: occasional Hypersomnia: no Fatigue/loss of energy: no Feelings of worthlessness: no Feelings of guilt: no Impaired concentration/indecisiveness: no Suicidal ideations: no  Crying spells: no Recent Stressors/Life Changes: no   Relationship problems: no   Family stress: no     Financial stress: no    Job stress: no    Recent death/loss: no GAD 7 : Generalized Anxiety Score 07/20/2020 06/22/2020 01/23/2020 09/08/2019  Nervous, Anxious, on Edge 0 0 0 1  Control/stop worrying 0 0 0 0  Worry too much - different things 0 1 0 0  Trouble relaxing 0 0 1 1  Restless 0 0 0 2  Easily annoyed or irritable 1 0 1 1  Afraid - awful might happen 0 0 0 0  Total GAD 7 Score 1 1 2 5   Anxiety Difficulty Not difficult at all Not difficult at all Not difficult at all Somewhat difficult   WEIGHT LOSS: She is scheduled to see OB/GYN.  Would like to discuss with Dr. starting Phentermine.  Sees midwife to discuss IUD change July 14th.  Attempted to start Contrave, but insurance was not covered. Will cover Ozempic, but concern for BS lowering.  She is interested  in weight loss -- has been working at this over past 3 months by cutting back on foods.  Does not like to perform restrictive diets.  Drinking a gallon water a day.  Has not lost weight.  Been exercising some, but does endorse more discomfort with this due to her current weight.  Has been walking and doing some yoga.  Her goal weight is 140ish -- but would be happy with 150-165.  Her lowest weight has been 140ish range in younger adult years.    Relevant past medical, surgical, family and social history reviewed and updated as indicated. Interim medical history since our last visit reviewed. Allergies and medications reviewed and updated.  Review of Systems  Constitutional:  Negative for activity change, appetite change, diaphoresis, fatigue and fever.   Respiratory:  Negative for cough, chest tightness, shortness of breath and wheezing.   Cardiovascular:  Negative for chest pain, palpitations and leg swelling.  Gastrointestinal: Negative.   Neurological: Negative.   Psychiatric/Behavioral: Negative.     Per HPI unless specifically indicated above     Objective:    BP 113/73   Pulse 76   Temp 98.7 F (37.1 C) (Oral)   Wt 195 lb (88.5 kg)   SpO2 99%   BMI 35.10 kg/m   Wt Readings from Last 3 Encounters:  07/20/20 195 lb (88.5 kg)  06/22/20 195 lb 6.4 oz (88.6 kg)  06/01/20 193 lb 3.2 oz (87.6 kg)    Physical Exam Vitals and nursing note reviewed.  Constitutional:      General: She is awake. She is not in acute distress.    Appearance: She is well-developed and well-groomed. She is obese. She is not ill-appearing or toxic-appearing.  HENT:     Head: Normocephalic.     Right Ear: Hearing normal.     Left Ear: Hearing normal.  Eyes:     General: Lids are normal.        Right eye: No discharge.        Left eye: No discharge.     Conjunctiva/sclera: Conjunctivae normal.     Pupils: Pupils are equal, round, and reactive to light.  Neck:     Thyroid: No thyromegaly.     Vascular: No carotid bruit.  Cardiovascular:     Rate and Rhythm: Normal rate and regular rhythm.     Heart sounds: Normal heart sounds. No murmur heard.   No gallop.  Pulmonary:     Effort: Pulmonary effort is normal. No accessory muscle usage or respiratory distress.     Breath sounds: Normal breath sounds.  Abdominal:     General: Bowel sounds are normal.     Palpations: Abdomen is soft.  Musculoskeletal:     Cervical back: Normal range of motion and neck supple.     Right lower leg: No edema.     Left lower leg: No edema.  Skin:    General: Skin is warm and dry.  Neurological:     Mental Status: She is alert and oriented to person, place, and time.     Deep Tendon Reflexes: Reflexes are normal and symmetric.     Reflex Scores:       Brachioradialis reflexes are 2+ on the right side and 2+ on the left side.      Patellar reflexes are 2+ on the right side and 2+ on the left side. Psychiatric:        Attention and Perception: Attention normal.  Mood and Affect: Mood normal.        Speech: Speech normal.        Behavior: Behavior normal. Behavior is cooperative.        Thought Content: Thought content normal.   Results for orders placed or performed in visit on 06/01/20  Microscopic Examination   Urine  Result Value Ref Range   WBC, UA None seen 0 - 5 /hpf   RBC 0-2 0 - 2 /hpf   Epithelial Cells (non renal) 0-10 0 - 10 /hpf   Bacteria, UA None seen None seen/Few  Urinalysis, Routine w reflex microscopic  Result Value Ref Range   Specific Gravity, UA 1.010 1.005 - 1.030   pH, UA 7.0 5.0 - 7.5   Color, UA Yellow Yellow   Appearance Ur Clear Clear   Leukocytes,UA Negative Negative   Protein,UA Negative Negative/Trace   Glucose, UA Negative Negative   Ketones, UA Negative Negative   RBC, UA 1+ (A) Negative   Bilirubin, UA Negative Negative   Urobilinogen, Ur 0.2 0.2 - 1.0 mg/dL   Nitrite, UA Negative Negative   Microscopic Examination See below:       Assessment & Plan:   Problem List Items Addressed This Visit       Other   Anxiety    Chronic, stable.  Denies SI/HI. Is currently off Effexor and tolerated transition off well, mood remains stable.  Wishes to remain off at this time.  Continue Klonopin only as needed, recommend using this VERY sparingly with goal to stop in future.  UDS and controlled substance contract if continues use.  Return in 6 months, sooner if worsening mood.       Obesity    BMI 35.10.  Insurance would not cover Contrave.  Could consider Ozempic as alternative if needed -- however reports aunt who had thyroid cancer and discussed with her contraindications for this.  Recommended eating smaller high protein, low fat meals more frequently and exercising 30 mins a day 5 times a  week with a goal of 10-15lb weight loss in the next 3 months. Patient voiced their understanding and motivation to adhere to these recommendations.  Return in 6 months.          Follow up plan: Return in about 6 months (around 01/24/2021) for Annual physical.

## 2020-07-20 NOTE — Assessment & Plan Note (Addendum)
BMI 35.10.  Insurance would not cover Contrave.  Could consider Ozempic as alternative if needed -- however reports aunt who had thyroid cancer and discussed with her contraindications for this.  Recommended eating smaller high protein, low fat meals more frequently and exercising 30 mins a day 5 times a week with a goal of 10-15lb weight loss in the next 3 months. Patient voiced their understanding and motivation to adhere to these recommendations.  Return in 6 months.

## 2020-11-30 ENCOUNTER — Encounter: Payer: No Typology Code available for payment source | Admitting: Nurse Practitioner

## 2020-11-30 ENCOUNTER — Ambulatory Visit: Payer: 59 | Admitting: Nurse Practitioner

## 2021-01-22 ENCOUNTER — Encounter: Payer: No Typology Code available for payment source | Admitting: Nurse Practitioner

## 2021-02-26 ENCOUNTER — Encounter: Payer: Self-pay | Admitting: Nurse Practitioner

## 2021-02-26 ENCOUNTER — Ambulatory Visit (INDEPENDENT_AMBULATORY_CARE_PROVIDER_SITE_OTHER): Payer: No Typology Code available for payment source | Admitting: Nurse Practitioner

## 2021-02-26 ENCOUNTER — Other Ambulatory Visit: Payer: Self-pay

## 2021-02-26 VITALS — BP 119/84 | HR 79 | Temp 98.4°F | Ht 63.5 in | Wt 193.0 lb

## 2021-02-26 DIAGNOSIS — Z1159 Encounter for screening for other viral diseases: Secondary | ICD-10-CM | POA: Diagnosis not present

## 2021-02-26 DIAGNOSIS — E6609 Other obesity due to excess calories: Secondary | ICD-10-CM

## 2021-02-26 DIAGNOSIS — E559 Vitamin D deficiency, unspecified: Secondary | ICD-10-CM

## 2021-02-26 DIAGNOSIS — Z136 Encounter for screening for cardiovascular disorders: Secondary | ICD-10-CM

## 2021-02-26 DIAGNOSIS — Z0000 Encounter for general adult medical examination without abnormal findings: Secondary | ICD-10-CM | POA: Diagnosis not present

## 2021-02-26 DIAGNOSIS — F419 Anxiety disorder, unspecified: Secondary | ICD-10-CM

## 2021-02-26 DIAGNOSIS — R0683 Snoring: Secondary | ICD-10-CM

## 2021-02-26 DIAGNOSIS — Z808 Family history of malignant neoplasm of other organs or systems: Secondary | ICD-10-CM

## 2021-02-26 DIAGNOSIS — Z803 Family history of malignant neoplasm of breast: Secondary | ICD-10-CM

## 2021-02-26 DIAGNOSIS — F17210 Nicotine dependence, cigarettes, uncomplicated: Secondary | ICD-10-CM

## 2021-02-26 DIAGNOSIS — Z6835 Body mass index (BMI) 35.0-35.9, adult: Secondary | ICD-10-CM

## 2021-02-26 DIAGNOSIS — Z79899 Other long term (current) drug therapy: Secondary | ICD-10-CM

## 2021-02-26 DIAGNOSIS — Z13220 Encounter for screening for lipoid disorders: Secondary | ICD-10-CM

## 2021-02-26 NOTE — Assessment & Plan Note (Signed)
Thyroid glands assessed today. No lumps or mass noted. TSH ordered today.  

## 2021-02-26 NOTE — Assessment & Plan Note (Signed)
Chronic, stable.  Denies SI/HI. Continue Klonopin only as needed, recommend using this VERY sparingly with goal to stop in future.  UDS and controlled substance contract if continues use.  Return in 6 months, sooner if worsening mood.

## 2021-02-26 NOTE — Assessment & Plan Note (Signed)
Started mammogram screening due to history, missed 6 months follow up due to cost. Plans on follow up. Breast exam done in office today.

## 2021-02-26 NOTE — Assessment & Plan Note (Signed)
Reported by her partner, no apneic periods noted. She is working on weight loss,   Consider sleep study in future if ongoing.

## 2021-02-26 NOTE — Assessment & Plan Note (Signed)
BMI 33.65 today down from 35.10.  Insurance would not cover Contrave.  Could consider Ozempic as alternative if needed -- however reports aunt who had thyroid cancer and discussed with her contraindications for this.  Recommended eating smaller high protein, low fat meals more frequently and exercising 30 mins a day 5 times a week with a goal of 10-15lb weight loss in the next 3 months. Patient voiced their understanding and motivation to adhere to these recommendations.  Return in 6 months.

## 2021-02-26 NOTE — Patient Instructions (Signed)
Healthy Eating °Following a healthy eating pattern may help you to achieve and maintain a healthy body weight, reduce the risk of chronic disease, and live a long and productive life. It is important to follow a healthy eating pattern at an appropriate calorie level for your body. Your nutritional needs should be met primarily through food by choosing a variety of nutrient-rich foods. °What are tips for following this plan? °Reading food labels °Read labels and choose the following: °Reduced or low sodium. °Juices with 100% fruit juice. °Foods with low saturated fats and high polyunsaturated and monounsaturated fats. °Foods with whole grains, such as whole wheat, cracked wheat, brown rice, and wild rice. °Whole grains that are fortified with folic acid. This is recommended for women who are pregnant or who want to become pregnant. °Read labels and avoid the following: °Foods with a lot of added sugars. These include foods that contain brown sugar, corn sweetener, corn syrup, dextrose, fructose, glucose, high-fructose corn syrup, honey, invert sugar, lactose, malt syrup, maltose, molasses, raw sugar, sucrose, trehalose, or turbinado sugar. °Do not eat more than the following amounts of added sugar per day: °6 teaspoons (25 g) for women. °9 teaspoons (38 g) for men. °Foods that contain processed or refined starches and grains. °Refined grain products, such as white flour, degermed cornmeal, white bread, and white rice. °Shopping °Choose nutrient-rich snacks, such as vegetables, whole fruits, and nuts. Avoid high-calorie and high-sugar snacks, such as potato chips, fruit snacks, and candy. °Use oil-based dressings and spreads on foods instead of solid fats such as butter, stick margarine, or cream cheese. °Limit pre-made sauces, mixes, and "instant" products such as flavored rice, instant noodles, and ready-made pasta. °Try more plant-protein sources, such as tofu, tempeh, black beans, edamame, lentils, nuts, and  seeds. °Explore eating plans such as the Mediterranean diet or vegetarian diet. °Cooking °Use oil to sauté or stir-fry foods instead of solid fats such as butter, stick margarine, or lard. °Try baking, boiling, grilling, or broiling instead of frying. °Remove the fatty part of meats before cooking. °Steam vegetables in water or broth. °Meal planning ° °At meals, imagine dividing your plate into fourths: °One-half of your plate is fruits and vegetables. °One-fourth of your plate is whole grains. °One-fourth of your plate is protein, especially lean meats, poultry, eggs, tofu, beans, or nuts. °Include low-fat dairy as part of your daily diet. °Lifestyle °Choose healthy options in all settings, including home, work, school, restaurants, or stores. °Prepare your food safely: °Wash your hands after handling raw meats. °Keep food preparation surfaces clean by regularly washing with hot, soapy water. °Keep raw meats separate from ready-to-eat foods, such as fruits and vegetables. °Cook seafood, meat, poultry, and eggs to the recommended internal temperature. °Store foods at safe temperatures. In general: °Keep cold foods at 40°F (4.4°C) or below. °Keep hot foods at 140°F (60°C) or above. °Keep your freezer at 0°F (-17.8°C) or below. °Foods are no longer safe to eat when they have been between the temperatures of 40°-140°F (4.4-60°C) for more than 2 hours. °What foods should I eat? °Fruits °Aim to eat 2 cup-equivalents of fresh, canned (in natural juice), or frozen fruits each day. Examples of 1 cup-equivalent of fruit include 1 small apple, 8 large strawberries, 1 cup canned fruit, ½ cup dried fruit, or 1 cup 100% juice. °Vegetables °Aim to eat 2½-3 cup-equivalents of fresh and frozen vegetables each day, including different varieties and colors. Examples of 1 cup-equivalent of vegetables include 2 medium carrots, 2 cups raw,   leafy greens, 1 cup chopped vegetable (raw or cooked), or 1 medium baked potato. °Grains °Aim to  eat 6 ounce-equivalents of whole grains each day. Examples of 1 ounce-equivalent of grains include 1 slice of bread, 1 cup ready-to-eat cereal, 3 cups popcorn, or ½ cup cooked rice, pasta, or cereal. °Meats and other proteins °Aim to eat 5-6 ounce-equivalents of protein each day. Examples of 1 ounce-equivalent of protein include 1 egg, 1/2 cup nuts or seeds, or 1 tablespoon (16 g) peanut butter. A cut of meat or fish that is the size of a deck of cards is about 3-4 ounce-equivalents. °Of the protein you eat each week, try to have at least 8 ounces come from seafood. This includes salmon, trout, herring, and anchovies. °Dairy °Aim to eat 3 cup-equivalents of fat-free or low-fat dairy each day. Examples of 1 cup-equivalent of dairy include 1 cup (240 mL) milk, 8 ounces (250 g) yogurt, 1½ ounces (44 g) natural cheese, or 1 cup (240 mL) fortified soy milk. °Fats and oils °Aim for about 5 teaspoons (21 g) per day. Choose monounsaturated fats, such as canola and olive oils, avocados, peanut butter, and most nuts, or polyunsaturated fats, such as sunflower, corn, and soybean oils, walnuts, pine nuts, sesame seeds, sunflower seeds, and flaxseed. °Beverages °Aim for six 8-oz glasses of water per day. Limit coffee to three to five 8-oz cups per day. °Limit caffeinated beverages that have added calories, such as soda and energy drinks. °Limit alcohol intake to no more than 1 drink a day for nonpregnant women and 2 drinks a day for men. One drink equals 12 oz of beer (355 mL), 5 oz of wine (148 mL), or 1½ oz of hard liquor (44 mL). °Seasoning and other foods °Avoid adding excess amounts of salt to your foods. Try flavoring foods with herbs and spices instead of salt. °Avoid adding sugar to foods. °Try using oil-based dressings, sauces, and spreads instead of solid fats. °This information is based on general [place] nutrition guidelines. For more information, visit choosemyplate.gov. Exact amounts may vary based on your nutrition  needs. °Summary °A healthy eating plan may help you to maintain a healthy weight, reduce the risk of chronic diseases, and stay active throughout your life. °Plan your meals. Make sure you eat the right portions of a variety of nutrient-rich foods. °Try baking, boiling, grilling, or broiling instead of frying. °Choose healthy options in all settings, including home, work, school, restaurants, or stores. °This information is not intended to replace advice given to you by your health care provider. Make sure you discuss any questions you have with your health care provider. °Document Revised: 09/11/2020 Document Reviewed: 09/11/2020 °[employer] Patient Education © 2022 [employer] ° °

## 2021-02-26 NOTE — Progress Notes (Signed)
BP 119/84    Pulse 79    Temp 98.4 F (36.9 C) (Oral)    Ht 5' 3.5" (1.613 m)    Wt 193 lb (87.5 kg)    SpO2 100%    BMI 33.65 kg/m    Subjective:    Patient ID: Molly Lynch, female    DOB: [birth year], 41 y.o.   MRN: [id number]  NOTE WRITTEN BY [employer] DNP STUDENT.  ASSESSMENT AND PLAN OF CARE REVIEWED WITH STUDENT, AGREE WITH ABOVE FINDINGS AND PLAN.   HPI: Molly Lynch is a 41 y.o. female presenting on 02/26/2021 for comprehensive medical examination. Current medical complaints include: having sinus allergy symptoms. She states symptoms are lasting longer with her current regimen. She recently changed from Claritin to Zyrtec to see if symptoms will improve, if no change she will return   She currently lives with: spouse Menopausal Symptoms: no  ANXIETY/STRESS Continues on Klonopin as needed.  Pt is aware of risks of benzo medication use to include increased sedation, respiratory suppression, falls, dependence and cardiovascular events.  Pt would like to continue treatment as benefit determined to outweigh risk.  Last filled August 09/08/19. Needs refill today. Duration:stable Anxious mood: no  Excessive worrying: no Irritability: no  Sweating: no Nausea: no Palpitations:no Hyperventilation: no Panic attacks: no Agoraphobia: no  Obscessions/compulsions: no Depressed mood: no Depression screen [facility] 2/9 02/26/2021 07/20/2020 06/22/2020 01/23/2020 09/08/2019  Decreased Interest 0 0 0 1 0  Down, Depressed, Hopeless 0 0 0 0 0  PHQ - 2 Score 0 0 0 1 0  Altered sleeping 1 1 2 1 1   Tired, decreased energy 1 0 2 1 1   Change in appetite 1 2 3 1 1   Feeling bad or failure about yourself  0 0 0 1 0  Trouble concentrating 1 0 1 1 2   Moving slowly or fidgety/restless 0 0 0 1 1  Suicidal thoughts 0 0 0 0 0  PHQ-9 Score 4 3 8 7 6   Difficult doing work/chores - Not difficult at all Not difficult at all Somewhat difficult Somewhat difficult  Some recent data might be hidden    Anhedonia: no Weight changes: no Insomnia: having trouble getting comfortable in bed which is affecting her sleep Hypersomnia: no Fatigue/loss of energy: no Feelings of worthlessness: no Feelings of guilt: no Impaired concentration/indecisiveness: no Suicidal ideations: no  Crying spells: no Recent Stressors/Life Changes: yes have a friend dz with cml worries about him   Relationship problems: no   Family stress: no     Financial stress: no    Job stress: no   Recent death/loss: no   GAD 7 : Generalized Anxiety Score 02/26/2021 07/20/2020 06/22/2020 01/23/2020  Nervous, Anxious, on Edge 1 0 0 0  Control/stop worrying 0 0 0 0  Worry too much - different things 0 0 1 0  Trouble relaxing 1 0 0 1  Restless 0 0 0 0  Easily annoyed or irritable 1 1 0 1  Afraid - awful might happen 0 0 0 0  Total GAD 7 Score 3 1 1 2   Anxiety Difficulty - Not difficult at all Not difficult at all Not difficult at all     Fall Risk 12/30/2017 12/30/2017 03/23/2020 02/26/2021 02/26/2021  Falls in the past year? 0 0 - 0 1  Number of falls in past year - - - - fell last week without injury  Was there an injury with Fall? - - - 0 0  Fall Risk  Category Calculator - - - 0 1  Fall Risk Category - - - Low Low  Patient Fall Risk Level Low fall risk Low fall risk Low fall risk Low fall risk Low fall risk  Patient at Risk for Falls Due to - - - No Fall Risks History of fall(s)  Fall risk Follow up Falls evaluation completed Falls evaluation completed - Falls evaluation completed Falls evaluation completed;Education provided      Past Medical History:  Past Medical History:  Diagnosis Date   Anxiety    Family history of breast cancer    Family history of thyroid cancer    Kidney stones    Migraine    Oral herpes    Pyelonephritis     Surgical History:  Past Surgical History:  Procedure Laterality Date   NO PAST SURGERIES      Medications:  Current Outpatient Medications on File Prior to Visit   Medication Sig   clonazePAM (KLONOPIN) 0.5 MG tablet Take 0.5 mg by mouth as needed. 1/2 tablet as needed   ipratropium (ATROVENT) 0.06 % nasal spray Place 2 sprays into both nostrils 4 (four) times daily.   levonorgestrel (MIRENA) 20 MCG/24HR IUD 1 each by Intrauterine route once.   loratadine (CLARITIN) 10 MG tablet Take 10 mg by mouth daily.   Probiotic Product (PROBIOTIC DAILY PO) Take by mouth 2 (two) times daily.   VITAMIN D PO Take by mouth.   cetirizine (ZYRTEC) 10 MG tablet Take 10 mg by mouth daily. (Patient not taking: Reported on 02/26/2021)   No current facility-administered medications on file prior to visit.    Allergies:  No Known Allergies  Social History:  Social History   Socioeconomic History   Marital status: Married    Spouse name: Not on file   Number of children: Not on file   Years of education: Not on file   Highest education level: Not on file  Occupational History   Not on file  Tobacco Use   Smoking status: Former    Packs/day: 0.25    Years: 25.00    Pack years: 6.25    Types: Cigarettes    Quit date: 03/24/2019    Years since quitting: 1.9   Smokeless tobacco: Never   Tobacco comments:    smoked off and on since the age of 41  Vaping Use   Vaping Use: Some days   Substances: Nicotine  Substance and Sexual Activity   Alcohol use: Yes    Comment: socially   Drug use: No   Sexual activity: Yes    Birth control/protection: I.U.D.  Other Topics Concern   Not on file  Social History Narrative   Not on file   Social Determinants of Health   Financial Resource Strain: Not on file  Food Insecurity: Not on file  Transportation Needs: Not on file  Physical Activity: Not on file  Stress: Not on file  Social Connections: Not on file  Intimate Partner Violence: Not on file   Social History   Tobacco Use  Smoking Status Former   Packs/day: 0.25   Years: 25.00   Pack years: 6.25   Types: Cigarettes   Quit date: 03/24/2019   Years  since quitting: 1.9  Smokeless Tobacco Never  Tobacco Comments   smoked off and on since the age of 89   Social History   Substance and Sexual Activity  Alcohol Use Yes   Comment: socially    Family History:  Family History  Problem Relation Age of Onset   Alcohol abuse Mother    Mental illness Mother    Thyroid disease Mother    Cancer Maternal Grandmother 78       breast   Diabetes Maternal Grandmother    Heart disease Maternal Grandmother        Multiple MIs and CABG   Hip fracture Maternal Grandmother    Breast cancer Maternal Grandmother    Hypertension Maternal Grandfather    Colon polyps Maternal Grandfather 61   Diabetes Paternal Grandmother    Cancer Maternal Aunt        Thyroid gland   Breast cancer Maternal Aunt    Clotting disorder Father        Factor II Prothrombin Gene Mutation   Clotting disorder Paternal Uncle        Factor II Prothrombin Gene Mutation    Past medical history, surgical history, medications, allergies, family history and social history reviewed with patient today and changes made to appropriate areas of the chart.   ROS All other ROS negative except what is listed above and in the HPI.      Objective:    BP 119/84    Pulse 79    Temp 98.4 F (36.9 C) (Oral)    Ht 5' 3.5" (1.613 m)    Wt 193 lb (87.5 kg)    SpO2 100%    BMI 33.65 kg/m   Wt Readings from Last 3 Encounters:  02/26/21 193 lb (87.5 kg)  07/20/20 195 lb (88.5 kg)  06/22/20 195 lb 6.4 oz (88.6 kg)    Physical Exam Constitutional:      Appearance: Normal appearance.  HENT:     Head: Normocephalic.     Salivary Glands: Right salivary gland is not diffusely enlarged or tender. Left salivary gland is not diffusely enlarged or tender.     Right Ear: Tympanic membrane normal.     Left Ear: There is impacted cerumen.     Ears:     Comments: Cerumen impacted note in left ear. Unable to view TM; Irrigate by CMA, TM visual after irrigation.    Nose: Nose normal.      Mouth/Throat:     Mouth: Mucous membranes are moist.     Pharynx: No oropharyngeal exudate or posterior oropharyngeal erythema.  Eyes:     General: Lids are normal.        Right eye: No discharge.        Left eye: No discharge.     Conjunctiva/sclera: Conjunctivae normal.     Pupils: Pupils are equal, round, and reactive to light.  Neck:     Thyroid: No thyroid mass, thyromegaly or thyroid tenderness.     Vascular: No carotid bruit.     Trachea: Trachea normal.  Cardiovascular:     Rate and Rhythm: Normal rate and regular rhythm.     Pulses: Normal pulses.     Heart sounds: Normal heart sounds. No murmur heard.   No gallop.  Pulmonary:     Effort: Pulmonary effort is normal.     Breath sounds: Normal breath sounds.  Chest:     Chest wall: No mass or tenderness.  Breasts:    Right: Normal. No swelling, bleeding, inverted nipple, mass, nipple discharge, skin change or tenderness.     Left: Normal. No swelling, bleeding, inverted nipple, mass, nipple discharge, skin change or tenderness.  Abdominal:     General: Abdomen is protuberant. Bowel sounds are normal.  Palpations: Abdomen is soft. There is no mass.  Musculoskeletal:        General: Normal range of motion.     Cervical back: No edema, erythema, rigidity or tenderness. No pain with movement or spinous process tenderness.     Right lower leg: No edema.     Left lower leg: No edema.  Lymphadenopathy:     Cervical: No cervical adenopathy.  Skin:    General: Skin is warm and dry.  Neurological:     General: No focal deficit present.     Mental Status: She is alert and oriented to person, place, and time.  Psychiatric:        Attention and Perception: Attention normal.        Mood and Affect: Mood normal.        Speech: Speech normal.        Behavior: Behavior normal.        Thought Content: Thought content normal.   Results for orders placed or performed in visit on 06/01/20  Microscopic Examination   Urine   Result Value Ref Range   WBC, UA None seen 0 - 5 /hpf   RBC 0-2 0 - 2 /hpf   Epithelial Cells (non renal) 0-10 0 - 10 /hpf   Bacteria, UA None seen None seen/Few  Urinalysis, Routine w reflex microscopic  Result Value Ref Range   Specific Gravity, UA 1.010 1.005 - 1.030   pH, UA 7.0 5.0 - 7.5   Color, UA Yellow Yellow   Appearance Ur Clear Clear   Leukocytes,UA Negative Negative   Protein,UA Negative Negative/Trace   Glucose, UA Negative Negative   Ketones, UA Negative Negative   RBC, UA 1+ (A) Negative   Bilirubin, UA Negative Negative   Urobilinogen, Ur 0.2 0.2 - 1.0 mg/dL   Nitrite, UA Negative Negative   Microscopic Examination See below:       Assessment & Plan:   Problem List Items Addressed This Visit       Other   Anxiety - Primary    Chronic, stable.  Denies SI/HI. Continue Klonopin only as needed, recommend using this VERY sparingly with goal to stop in future.  UDS and controlled substance contract if continues use.  Return in 6 months, sooner if worsening mood.      Relevant Orders   CBC with Differential/Platelet   Comprehensive metabolic panel   [id number] 11+Oxyco+Alc+Crt-Bund   Family history of breast cancer    Started mammogram screening due to history, missed 6 months follow up due to cost. Plans on follow up. Breast exam done in office today.      Family history of thyroid cancer    Thyroid glands assessed today. No lumps or mass noted. TSH ordered today.       Relevant Orders   TSH   Nicotine dependence, cigarettes, uncomplicated   Obesity    BMI 33.65 today down from 35.10.  Insurance would not cover Contrave.  Could consider Ozempic as alternative if needed -- however reports aunt who had thyroid cancer and discussed with her contraindications for this.  Recommended eating smaller high protein, low fat meals more frequently and exercising 30 mins a day 5 times a week with a goal of 10-15lb weight loss in the next 3 months. Patient voiced their  understanding and motivation to adhere to these recommendations.  Return in 6 months.       Snoring    Reported by her partner, no apneic periods noted.  She is working on weight loss,   Consider sleep study in future if ongoing.      Other Visit Diagnoses     Vitamin D deficiency       History of low levels, recheck today and start supplement as needed.   Relevant Orders   VITAMIN D 25 Hydroxy (Vit-D Deficiency, Fractures)   Long-term current use of benzodiazepine       Obtain UDS next visit.   Relevant Orders   [id number] 11+Oxyco+Alc+Crt-Bund   Encounter for lipid screening for cardiovascular disease       Lipid panel on labs today.   Relevant Orders   Lipid Panel w/o Chol/HDL Ratio   Need for hepatitis C screening test       Hep C screening today, discussed with patient.   Relevant Orders   Hepatitis C antibody   Encounter for annual physical exam       Annual physical today with labs, health maintenance reviewed.        Follow up plan: Return in about 6 months (around 08/26/2021) for MOOD.   LABORATORY TESTING:  - Pap smear: up to date  IMMUNIZATIONS:   - Tdap: Tetanus vaccination status reviewed: last tetanus booster within 10 years. - Influenza: Given elsewhere - Pneumovax: Not applicable - Prevnar: Not applicable - COVID: Up to date - HPV: Not applicable - Shingrix vaccine: Not applicable  SCREENING: -Mammogram: up to date missed 6 mos f/u due to cost but is planning on following up - Colonoscopy: Not applicable  - Bone Density: Not applicable  -Hearing Test: Not applicable  -Spirometry: Not applicable   PATIENT COUNSELING:   Advised to take 1 mg of folate supplement per day if capable of pregnancy.   Sexuality: Discussed sexually transmitted diseases, partner selection, use of condoms, avoidance of unintended pregnancy  and contraceptive alternatives.   Advised to avoid cigarette smoking.  I discussed with the patient that most people either abstain  from alcohol or drink within safe limits (<=14/week and <=4 drinks/occasion for males, <=7/weeks and <= 3 drinks/occasion for females) and that the risk for alcohol disorders and other health effects rises proportionally with the number of drinks per week and how often a drinker exceeds daily limits.  Discussed cessation/primary prevention of drug use and availability of treatment for abuse.   Diet: Encouraged to adjust caloric intake to maintain  or achieve ideal body weight, to reduce intake of dietary saturated fat and total fat, to limit sodium intake by avoiding high sodium foods and not adding table salt, and to maintain adequate dietary potassium and calcium preferably from fresh fruits, vegetables, and low-fat dairy products.    stressed the importance of regular exercise  Injury prevention: Discussed safety belts, safety helmets, smoke detector, smoking near bedding or upholstery.   Dental health: Discussed importance of regular tooth brushing, flossing, and dental visits.    NEXT PREVENTATIVE PHYSICAL DUE IN 1 YEAR. Return in about 6 months (around 08/26/2021) for MOOD.

## 2021-02-27 ENCOUNTER — Other Ambulatory Visit: Payer: Self-pay | Admitting: Nurse Practitioner

## 2021-02-27 LAB — COMPREHENSIVE METABOLIC PANEL
ALT: 22 IU/L (ref 0–32)
AST: 23 IU/L (ref 0–40)
Albumin/Globulin Ratio: 1.9 (ref 1.2–2.2)
Albumin: 4.9 g/dL — ABNORMAL HIGH (ref 3.8–4.8)
Alkaline Phosphatase: 105 IU/L (ref 44–121)
BUN/Creatinine Ratio: 15 (ref 9–23)
BUN: 10 mg/dL (ref 6–24)
Bilirubin Total: 0.2 mg/dL (ref 0.0–1.2)
CO2: 20 mmol/L (ref 20–29)
Calcium: 9.8 mg/dL (ref 8.7–10.2)
Chloride: 102 mmol/L (ref 96–106)
Creatinine, Ser: 0.68 mg/dL (ref 0.57–1.00)
Globulin, Total: 2.6 g/dL (ref 1.5–4.5)
Glucose: 83 mg/dL (ref 70–99)
Potassium: 4.5 mmol/L (ref 3.5–5.2)
Sodium: 138 mmol/L (ref 134–144)
Total Protein: 7.5 g/dL (ref 6.0–8.5)
eGFR: 112 mL/min/{1.73_m2} (ref >59)

## 2021-02-27 LAB — TSH: TSH: 1.36 u[IU]/mL (ref 0.450–4.500)

## 2021-02-27 LAB — CBC WITH DIFFERENTIAL/PLATELET
Basophils Absolute: 0.1 10*3/uL (ref 0.0–0.2)
Basos: 1 %
EOS (ABSOLUTE): 0.3 10*3/uL (ref 0.0–0.4)
Eos: 4 %
Hematocrit: 43.7 % (ref 34.0–46.6)
Hemoglobin: 14.7 g/dL (ref 11.1–15.9)
Immature Grans (Abs): 0 10*3/uL (ref 0.0–0.1)
Immature Granulocytes: 0 %
Lymphocytes Absolute: 2.4 10*3/uL (ref 0.7–3.1)
Lymphs: 28 %
MCH: 30.1 pg (ref 26.6–33.0)
MCHC: 33.6 g/dL (ref 31.5–35.7)
MCV: 90 fL (ref 79–97)
Monocytes Absolute: 0.8 10*3/uL (ref 0.1–0.9)
Monocytes: 9 %
Neutrophils Absolute: 4.8 10*3/uL (ref 1.4–7.0)
Neutrophils: 58 %
Platelets: 433 10*3/uL (ref 150–450)
RBC: 4.88 x10E6/uL (ref 3.77–5.28)
RDW: 11.7 % (ref 11.7–15.4)
WBC: 8.3 10*3/uL (ref 3.4–10.8)

## 2021-02-27 LAB — VITAMIN D 25 HYDROXY (VIT D DEFICIENCY, FRACTURES): Vit D, 25-Hydroxy: 20 ng/mL — ABNORMAL LOW (ref 30.0–100.0)

## 2021-02-27 LAB — LIPID PANEL W/O CHOL/HDL RATIO
Cholesterol, Total: 153 mg/dL (ref 100–199)
HDL: 63 mg/dL (ref >39)
LDL Chol Calc (NIH): 73 mg/dL (ref 0–99)
Triglycerides: 89 mg/dL (ref 0–149)
VLDL Cholesterol Cal: 17 mg/dL (ref 5–40)

## 2021-02-27 LAB — HEPATITIS C ANTIBODY: Hep C Virus Ab: <0.1 s/co ratio (ref 0.0–0.9)

## 2021-02-27 MED ORDER — CHOLECALCIFEROL 1.25 MG (50000 UT) PO TABS: 1.0000 | ORAL_TABLET | ORAL | 4 refills | Status: DC

## 2021-02-27 NOTE — Progress Notes (Signed)
Contacted via MyChart   Good morning Molly Lynch, your labs have returned and overall they look fantastic with exception of Vitamin D level continuing to be on low side.  I am going to send in a higher dose of Vitamin D3 for you to take weekly for awhile and we will recheck next visit.  Everything else looks fantastic.  Any questions? Keep being awesome!!  Thank you for allowing me to participate in your care.  I appreciate you. Kindest regards, Jennie Bolar

## 2021-04-19 ENCOUNTER — Other Ambulatory Visit: Payer: Self-pay | Admitting: Nurse Practitioner

## 2021-04-19 LAB — DRUG SCREEN 764883 11+OXYCO+ALC+CRT-BUND
Amphetamines, Urine: NEGATIVE ng/mL
BENZODIAZ UR QL: NEGATIVE ng/mL
Barbiturate: NEGATIVE ng/mL
Cannabinoid Quant, Ur: NEGATIVE ng/mL
Cocaine (Metabolite): NEGATIVE ng/mL
Creatinine: 16 mg/dL — ABNORMAL LOW (ref 20.0–300.0)
Ethanol: NEGATIVE %
Meperidine: NEGATIVE ng/mL
Methadone Screen, Urine: NEGATIVE ng/mL
OPIATE SCREEN URINE: NEGATIVE ng/mL
Oxycodone/Oxymorphone, Urine: NEGATIVE ng/mL
Phencyclidine: NEGATIVE ng/mL
Propoxyphene: NEGATIVE ng/mL
Tramadol: NEGATIVE ng/mL
pH, Urine: 6.8 (ref 4.5–8.9)

## 2021-04-19 LAB — SPECIFIC GRAVITY: Specific Gravity: 1.0043

## 2021-04-22 NOTE — Telephone Encounter (Signed)
Requested medications are due for refill today.  unsure ? ?Requested medications are on the active medications list.  yes ? ?Last refill. 07/20/2020 ? ?Future visit scheduled.   yes ? ?Notes to clinic.  Medication refill not delegated. Historical  medication historical provider. ? ? ? ?Requested Prescriptions  ?Pending Prescriptions Disp Refills  ? clonazePAM (KLONOPIN) 0.5 MG tablet [Pharmacy Med Name: CLONAZEPAM 0.5 MG TABLET] 30 tablet 0  ?  Sig: Take 0.5 tablets (0.25 mg total) by mouth daily as needed for anxiety.  ?  ? Not Delegated - Psychiatry: Anxiolytics/Hypnotics 2 Failed - 04/19/2021  5:42 PM  ?  ?  Failed - This refill cannot be delegated  ?  ?  Passed - Urine Drug Screen completed in last 360 days  ?  ?  Passed - Patient is not pregnant  ?  ?  Passed - Valid encounter within last 6 months  ?  Recent Outpatient Visits   ? ?      ? 1 month ago Anxiety  ? [facility] [place], [place] T, NP  ? 9 months ago Anxiety  ? [facility] [place], [place] T, NP  ? 10 months ago Anxiety  ? [facility] [place], [place] T, NP  ? 10 months ago Other microscopic hematuria  ? [facility] [place], Lauren A, NP  ? 11 months ago Acute midline low back pain without sciatica  ? [facility] [place], Corrie Dandy T, NP  ? ?  ?  ?Future Appointments   ? ?        ? In 4 months Cannady, Dorie Rank, NP [employer], [facility]  ? ?  ? ?  ?  ?  ?  ?

## 2021-07-02 ENCOUNTER — Encounter: Payer: Self-pay | Admitting: Nurse Practitioner

## 2021-07-04 ENCOUNTER — Telehealth: Payer: No Typology Code available for payment source | Admitting: Nurse Practitioner

## 2021-07-08 ENCOUNTER — Telehealth: Payer: No Typology Code available for payment source | Admitting: Nurse Practitioner

## 2021-07-11 ENCOUNTER — Encounter: Payer: Self-pay | Admitting: Nurse Practitioner

## 2021-07-11 ENCOUNTER — Telehealth: Payer: No Typology Code available for payment source | Admitting: Nurse Practitioner

## 2021-07-11 DIAGNOSIS — F419 Anxiety disorder, unspecified: Secondary | ICD-10-CM | POA: Diagnosis not present

## 2021-07-11 MED ORDER — BUPROPION HCL ER (XL) 150 MG PO TB24: ORAL_TABLET | ORAL | 1 refills | Status: DC

## 2021-07-11 NOTE — Patient Instructions (Signed)

## 2021-07-11 NOTE — Assessment & Plan Note (Addendum)
Chronic, exacerbated by work stressors.  Denies SI/HI. Wishes to start Wellbutrin XL which worked well for her in past, ?some ADD/ADHD present.  Will start Wellbutrin 150 MG XL daily for one week and if tolerating increase to 300 MG daily.  Continue Klonopin only as needed, recommend using this VERY sparingly with goal to stop in future.  UDS up to date and controlled substance contract next visit.  Return as scheduled in July.

## 2021-07-11 NOTE — Progress Notes (Signed)
There were no vitals taken for this visit.   Subjective:    Patient ID: Molly Lynch, female    DOB: 08-26-1980, 41 y.o.   MRN: [id number]  HPI: Molly Lynch is a 41 y.o. female  Chief Complaint  Patient presents with   Medication Management    Patient is here for a refill on Wellbutrin medication. Patient says work has gotten a bit more stressful and she would like to discuss with provider as she noticed lately she is wanting to take more than the 1/2 tablet due to work-related situations.   This visit was completed via telephone due to the restrictions of the COVID-19 pandemic. All issues as above were discussed and addressed but no physical exam was performed. If it was felt that the patient should be evaluated in the office, they were directed there. The patient verbally consented to this visit. Patient was unable to complete an audio/visual visit due to poor connection. Due to the catastrophic nature of the COVID-19 pandemic, this visit was done through audio contact only. Location of the patient: work Location of the provider: work Those involved with this call:  Provider: Marnee Guarneri, DNP CMA: Irena Reichmann, [place] Desk/Registration: [employer]  Time spent on call:  21 minutes on the phone discussing health concerns. 15 minutes total spent in review of patient's record and preparation of their chart.  I verified patient identity using two factors (patient name and date of birth). Patient consents verbally to being seen via telemedicine visit today.    ANXIETY Work has become more stressful and she is looking into changing jobs.  Is thinking she needs to take something daily -- is interested in Wellbutrin as reports this worked well in past.  Continues on [place] as needed.  Pt is aware of risks of benzo medication use to include increased sedation, respiratory suppression, falls, dependence and cardiovascular events.  Pt would like to continue treatment as  benefit determined to outweigh risk.  Last filled August 05/14/21.  Mood status: uncontrolled Satisfied with current treatment?: yes Symptom severity: moderate  Duration of current treatment : chronic Side effects: no Medication compliance: good compliance Psychotherapy/counseling: none Previous psychiatric medications: SSRIs and Effexor, and Wellbutrin Depressed mood: no Anxious mood: yes Anhedonia: no Significant weight loss or gain: no Insomnia: yes hard to fall asleep Fatigue: no Feelings of worthlessness or guilt: no Impaired concentration/indecisiveness: yes Suicidal ideations: no Hopelessness: no Crying spells: no    07/11/2021   11:25 AM 02/26/2021    2:29 PM 07/20/2020    4:16 PM 06/22/2020    4:26 PM 01/23/2020   10:58 AM  Depression screen PHQ 2/9  Decreased Interest 0 0 0 0 1  Down, Depressed, Hopeless 0 0 0 0 0  PHQ - 2 Score 0 0 0 0 1  Altered sleeping $RemoveBeforeDE'2 1 1 2 1  'qmLrNkoHXTnmImD$ Tired, decreased energy 0 1 0 2 1  Change in appetite $RemoveBef'1 1 2 3 1  'QZcXnTqgAp$ Feeling bad or failure about yourself  0 0 0 0 1  Trouble concentrating 2 1 0 1 1  Moving slowly or fidgety/restless 1 0 0 0 1  Suicidal thoughts 0 0 0 0 0  PHQ-9 Score $RemoveBef'6 4 3 8 7  'KNoGzdXWFL$ Difficult doing work/chores Somewhat difficult  Not difficult at all Not difficult at all Somewhat difficult       07/11/2021   11:27 AM 02/26/2021    2:30 PM 07/20/2020    4:14 PM 06/22/2020  4:27 PM  GAD 7 : Generalized Anxiety Score  Nervous, Anxious, on Edge 2 1 0 0  Control/stop worrying 1 0 0 0  Worry too much - different things 2 0 0 1  Trouble relaxing 1 1 0 0  Restless 1 0 0 0  Easily annoyed or irritable $RemoveBefo'2 1 1 'VyMiQXVETHy$ 0  Afraid - awful might happen 0 0 0 0  Total GAD 7 Score $Remov'9 3 1 1  'nYImjN$ Anxiety Difficulty Somewhat difficult  Not difficult at all Not difficult at all   Relevant past medical, surgical, family and social history reviewed and updated as indicated. Interim medical history since our last visit reviewed. Allergies and medications reviewed and  updated.  Review of Systems  Constitutional:  Negative for activity change, appetite change, diaphoresis, fatigue and fever.  Respiratory:  Negative for cough, chest tightness, shortness of breath and wheezing.   Cardiovascular:  Negative for chest pain, palpitations and leg swelling.  Gastrointestinal: Negative.   Neurological: Negative.   Psychiatric/Behavioral:  Positive for decreased concentration. Negative for self-injury, sleep disturbance and suicidal ideas. The patient is nervous/anxious.     Per HPI unless specifically indicated above     Objective:    There were no vitals taken for this visit.  Wt Readings from Last 3 Encounters:  02/26/21 193 lb (87.5 kg)  07/20/20 195 lb (88.5 kg)  06/22/20 195 lb 6.4 oz (88.6 kg)    Physical Exam  Unable to perform due to telephone visit only.  Results for orders placed or performed in visit on 02/26/21  CBC with Differential/Platelet  Result Value Ref Range   WBC 8.3 3.4 - 10.8 x10E3/uL   RBC 4.88 3.77 - 5.28 x10E6/uL   Hemoglobin 14.7 11.1 - 15.9 g/dL   Hematocrit 43.7 34.0 - 46.6 %   MCV 90 79 - 97 fL   MCH 30.1 26.6 - 33.0 pg   MCHC 33.6 31.5 - 35.7 g/dL   RDW 11.7 11.7 - 15.4 %   Platelets 433 150 - 450 x10E3/uL   Neutrophils 58 Not Estab. %   Lymphs 28 Not Estab. %   Monocytes 9 Not Estab. %   Eos 4 Not Estab. %   Basos 1 Not Estab. %   Neutrophils Absolute 4.8 1.4 - 7.0 x10E3/uL   Lymphocytes Absolute 2.4 0.7 - 3.1 x10E3/uL   Monocytes Absolute 0.8 0.1 - 0.9 x10E3/uL   EOS (ABSOLUTE) 0.3 0.0 - 0.4 x10E3/uL   Basophils Absolute 0.1 0.0 - 0.2 x10E3/uL   Immature Granulocytes 0 Not Estab. %   Immature Grans (Abs) 0.0 0.0 - 0.1 x10E3/uL  Comprehensive metabolic panel  Result Value Ref Range   Glucose 83 70 - 99 mg/dL   BUN 10 6 - 24 mg/dL   Creatinine, Ser 0.68 0.57 - 1.00 mg/dL   eGFR 112 >59 mL/min/1.73   BUN/Creatinine Ratio 15 9 - 23   Sodium 138 134 - 144 mmol/L   Potassium 4.5 3.5 - 5.2 mmol/L    Chloride 102 96 - 106 mmol/L   CO2 20 20 - 29 mmol/L   Calcium 9.8 8.7 - 10.2 mg/dL   Total Protein 7.5 6.0 - 8.5 g/dL   Albumin 4.9 (H) 3.8 - 4.8 g/dL   Globulin, Total 2.6 1.5 - 4.5 g/dL   Albumin/Globulin Ratio 1.9 1.2 - 2.2   Bilirubin Total 0.2 0.0 - 1.2 mg/dL   Alkaline Phosphatase 105 44 - 121 IU/L   AST 23 0 - 40 IU/L   ALT  22 0 - 32 IU/L  Lipid Panel w/o Chol/HDL Ratio  Result Value Ref Range   Cholesterol, Total 153 100 - 199 mg/dL   Triglycerides 89 0 - 149 mg/dL   HDL 63 >39 mg/dL   VLDL Cholesterol Cal 17 5 - 40 mg/dL   LDL Chol Calc (NIH) 73 0 - 99 mg/dL  TSH  Result Value Ref Range   TSH 1.360 0.450 - 4.500 uIU/mL  VITAMIN D 25 Hydroxy (Vit-D Deficiency, Fractures)  Result Value Ref Range   Vit D, 25-Hydroxy 20.0 (L) 30.0 - 100.0 ng/mL  Hepatitis C antibody  Result Value Ref Range   Hep C Virus Ab <0.1 0.0 - 0.9 s/co ratio  364680 11+Oxyco+Alc+Crt-Bund  Result Value Ref Range   Ethanol Negative Cutoff=0.020 %   Amphetamines, Urine Negative Cutoff=1000 ng/mL   Barbiturate Negative Cutoff=200 ng/mL   BENZODIAZ UR QL Negative Cutoff=200 ng/mL   Cannabinoid Quant, Ur Negative Cutoff=50 ng/mL   Cocaine (Metabolite) Negative Cutoff=300 ng/mL   OPIATE SCREEN URINE Negative Cutoff=300 ng/mL   Oxycodone/Oxymorphone, Urine Negative Cutoff=300 ng/mL   Phencyclidine Negative Cutoff=25 ng/mL   Methadone Screen, Urine Negative Cutoff=300 ng/mL   Propoxyphene Negative Cutoff=300 ng/mL   Meperidine Negative Cutoff=200 ng/mL   Tramadol Negative Cutoff=200 ng/mL   Creatinine 16.0 (L) 20.0 - 300.0 mg/dL   pH, Urine 6.8 4.5 - 8.9  Specific Gravity  Result Value Ref Range   Specific Gravity 1.0043       Assessment & Plan:   Problem List Items Addressed This Visit       Other   Anxiety    Chronic, exacerbated by work stressors.  Denies SI/HI. Wishes to start Wellbutrin XL which worked well for her in past, ?some ADD/ADHD present.  Will start Wellbutrin 150 MG XL daily  for one week and if tolerating increase to 300 MG daily.  Continue Klonopin only as needed, recommend using this VERY sparingly with goal to stop in future.  UDS up to date and controlled substance contract next visit.  Return as scheduled in July.      Relevant Medications   buPROPion (WELLBUTRIN XL) 150 MG 24 hr tablet    I discussed the assessment and treatment plan with the patient. The patient was provided an opportunity to ask questions and all were answered. The patient agreed with the plan and demonstrated an understanding of the instructions.   The patient was advised to call back or seek an in-person evaluation if the symptoms worsen or if the condition fails to improve as anticipated.   I provided 21+ minutes of time during this encounter.   Follow up plan: Return for as scheduled on 08/26/21.

## 2021-08-26 ENCOUNTER — Ambulatory Visit: Payer: No Typology Code available for payment source | Admitting: Nurse Practitioner

## 2021-09-01 NOTE — Patient Instructions (Signed)

## 2021-09-06 ENCOUNTER — Ambulatory Visit: Payer: No Typology Code available for payment source | Admitting: Nurse Practitioner

## 2021-09-06 ENCOUNTER — Encounter: Payer: Self-pay | Admitting: Nurse Practitioner

## 2021-09-06 VITALS — BP 128/79 | HR 78 | Temp 98.7°F | Ht 63.5 in | Wt 193.9 lb

## 2021-09-06 DIAGNOSIS — F419 Anxiety disorder, unspecified: Secondary | ICD-10-CM

## 2021-09-06 DIAGNOSIS — Z6833 Body mass index (BMI) 33.0-33.9, adult: Secondary | ICD-10-CM | POA: Diagnosis not present

## 2021-09-06 DIAGNOSIS — Z79899 Other long term (current) drug therapy: Secondary | ICD-10-CM | POA: Insufficient documentation

## 2021-09-06 DIAGNOSIS — E6609 Other obesity due to excess calories: Secondary | ICD-10-CM | POA: Diagnosis not present

## 2021-09-06 NOTE — Assessment & Plan Note (Signed)
Chronic, improving with Wellbutrin.  Denies SI/HI. Will continue Wellbutrin 300 MG XL daily as is offering benefit.  Continue Klonopin only as needed, recommend using this VERY sparingly with goal to stop in future -- she minimally uses.  UDS up to date and controlled substance contract signed today.  Return for physical in January.

## 2021-09-06 NOTE — Assessment & Plan Note (Signed)
BMI 33.81.  Recommended eating smaller high protein, low fat meals more frequently and exercising 30 mins a day 5 times a week with a goal of 10-15lb weight loss in the next 3 months. Patient voiced their understanding and motivation to adhere to these recommendations.  

## 2021-09-06 NOTE — Progress Notes (Signed)
BP 128/79   Pulse 78   Temp 98.7 F (37.1 C) (Oral)   Ht 5' 3.5" (1.613 m)   Wt 193 lb 14.4 oz (88 kg)   SpO2 99%   BMI 33.81 kg/m    Subjective:    Patient ID: Molly Lynch, female    DOB: October 26, 1980, 41 y.o.   MRN: [id number]  HPI: Molly Lynch is a 41 y.o. female  Chief Complaint  Patient presents with   Mood    Patient is here for six month follow up.   Medication Management    Patient says everything has gotten better and she is seeing a difference. Patient says she is taking 2 tablets a day for the past few days.    DEPRESSION/ANXIETY Taking Wellbutrin XL 300 MG, started this 07/11/21 which is improving mood (started 2 a day this week).  Continues on Klonopin as needed.  Pt is aware of risks of benzo medication use to include increased sedation, respiratory suppression, falls, dependence and cardiovascular events.  Pt would like to continue treatment as benefit determined to outweigh risk. Last filled August 05/14/21 -- has only taken one or two since last prescribed.  Mood status: stable Satisfied with current treatment?: yes Symptom severity: moderate  Duration of current treatment : chronic Side effects: no Medication compliance: good compliance Psychotherapy/counseling: yes in the past Depressed mood:  occasional work related Anxious mood: occasional work related Anhedonia: no Significant weight loss or gain: no Insomnia: occasional -- with snoring, is wearing mouth guard which is helping some Fatigue: no Feelings of worthlessness or guilt: no Impaired concentration/indecisiveness: occasional Suicidal ideations: no Hopelessness: no Crying spells: not unwarranted on occasion    09/06/2021    2:48 PM 07/11/2021   11:25 AM 02/26/2021    2:29 PM 07/20/2020    4:16 PM 06/22/2020    4:26 PM  Depression screen PHQ 2/9  Decreased Interest 0 0 0 0 0  Down, Depressed, Hopeless 0 0 0 0 0  PHQ - 2 Score 0 0 0 0 0  Altered sleeping 2 2 1 1 2   Tired,  decreased energy 1 0 1 0 2  Change in appetite 1 1 1 2 3   Feeling bad or failure about yourself  0 0 0 0 0  Trouble concentrating 1 2 1  0 1  Moving slowly or fidgety/restless 0 1 0 0 0  Suicidal thoughts 0 0 0 0 0  PHQ-9 Score 5 6 4 3 8   Difficult doing work/chores Somewhat difficult Somewhat difficult  Not difficult at all Not difficult at all       09/06/2021    2:49 PM 07/11/2021   11:27 AM 02/26/2021    2:30 PM 07/20/2020    4:14 PM  GAD 7 : Generalized Anxiety Score  Nervous, Anxious, on Edge 1 2 1  0  Control/stop worrying 0 1 0 0  Worry too much - different things 0 2 0 0  Trouble relaxing 1 1 1  0  Restless 1 1 0 0  Easily annoyed or irritable 1 2 1 1   Afraid - awful might happen 0 0 0 0  Total GAD 7 Score 4 9 3 1   Anxiety Difficulty Somewhat difficult Somewhat difficult  Not difficult at all   Relevant past medical, surgical, family and social history reviewed and updated as indicated. Interim medical history since our last visit reviewed. Allergies and medications reviewed and updated.  Review of Systems  Constitutional:  Negative for activity change,  appetite change, diaphoresis, fatigue and fever.  Respiratory:  Negative for cough, chest tightness, shortness of breath and wheezing.   Cardiovascular:  Negative for chest pain, palpitations and leg swelling.  Gastrointestinal: Negative.   Neurological: Negative.   Psychiatric/Behavioral: Negative.      Per HPI unless specifically indicated above     Objective:    BP 128/79   Pulse 78   Temp 98.7 F (37.1 C) (Oral)   Ht 5' 3.5" (1.613 m)   Wt 193 lb 14.4 oz (88 kg)   SpO2 99%   BMI 33.81 kg/m   Wt Readings from Last 3 Encounters:  09/06/21 193 lb 14.4 oz (88 kg)  02/26/21 193 lb (87.5 kg)  07/20/20 195 lb (88.5 kg)    Physical Exam Vitals and nursing note reviewed.  Constitutional:      General: She is awake. She is not in acute distress.    Appearance: She is well-developed and well-groomed. She is  obese. She is not ill-appearing or toxic-appearing.  HENT:     Head: Normocephalic.     Right Ear: Hearing normal.     Left Ear: Hearing normal.  Eyes:     General: Lids are normal.        Right eye: No discharge.        Left eye: No discharge.     Conjunctiva/sclera: Conjunctivae normal.     Pupils: Pupils are equal, round, and reactive to light.  Neck:     Thyroid: No thyromegaly.     Vascular: No carotid bruit.  Cardiovascular:     Rate and Rhythm: Normal rate and regular rhythm.     Heart sounds: Normal heart sounds. No murmur heard.    No gallop.  Pulmonary:     Effort: Pulmonary effort is normal. No accessory muscle usage or respiratory distress.     Breath sounds: Normal breath sounds.  Abdominal:     General: Bowel sounds are normal.     Palpations: Abdomen is soft.  Musculoskeletal:     Cervical back: Normal range of motion and neck supple.     Right lower leg: No edema.     Left lower leg: No edema.  Skin:    General: Skin is warm and dry.  Neurological:     Mental Status: She is alert and oriented to person, place, and time.     Deep Tendon Reflexes: Reflexes are normal and symmetric.     Reflex Scores:      Brachioradialis reflexes are 2+ on the right side and 2+ on the left side.      Patellar reflexes are 2+ on the right side and 2+ on the left side. Psychiatric:        Attention and Perception: Attention normal.        Mood and Affect: Mood normal.        Speech: Speech normal.        Behavior: Behavior normal. Behavior is cooperative.        Thought Content: Thought content normal.    Results for orders placed or performed in visit on 02/26/21  CBC with Differential/Platelet  Result Value Ref Range   WBC 8.3 3.4 - 10.8 x10E3/uL   RBC 4.88 3.77 - 5.28 x10E6/uL   Hemoglobin 14.7 11.1 - 15.9 g/dL   Hematocrit 43.7 34.0 - 46.6 %   MCV 90 79 - 97 fL   MCH 30.1 26.6 - 33.0 pg   MCHC 33.6 31.5 - 35.7 g/dL  RDW 11.7 11.7 - 15.4 %   Platelets 433 150 -  450 x10E3/uL   Neutrophils 58 Not Estab. %   Lymphs 28 Not Estab. %   Monocytes 9 Not Estab. %   Eos 4 Not Estab. %   Basos 1 Not Estab. %   Neutrophils Absolute 4.8 1.4 - 7.0 x10E3/uL   Lymphocytes Absolute 2.4 0.7 - 3.1 x10E3/uL   Monocytes Absolute 0.8 0.1 - 0.9 x10E3/uL   EOS (ABSOLUTE) 0.3 0.0 - 0.4 x10E3/uL   Basophils Absolute 0.1 0.0 - 0.2 x10E3/uL   Immature Granulocytes 0 Not Estab. %   Immature Grans (Abs) 0.0 0.0 - 0.1 x10E3/uL  Comprehensive metabolic panel  Result Value Ref Range   Glucose 83 70 - 99 mg/dL   BUN 10 6 - 24 mg/dL   Creatinine, Ser 0.68 0.57 - 1.00 mg/dL   eGFR 112 >59 mL/min/1.73   BUN/Creatinine Ratio 15 9 - 23   Sodium 138 134 - 144 mmol/L   Potassium 4.5 3.5 - 5.2 mmol/L   Chloride 102 96 - 106 mmol/L   CO2 20 20 - 29 mmol/L   Calcium 9.8 8.7 - 10.2 mg/dL   Total Protein 7.5 6.0 - 8.5 g/dL   Albumin 4.9 (H) 3.8 - 4.8 g/dL   Globulin, Total 2.6 1.5 - 4.5 g/dL   Albumin/Globulin Ratio 1.9 1.2 - 2.2   Bilirubin Total 0.2 0.0 - 1.2 mg/dL   Alkaline Phosphatase 105 44 - 121 IU/L   AST 23 0 - 40 IU/L   ALT 22 0 - 32 IU/L  Lipid Panel w/o Chol/HDL Ratio  Result Value Ref Range   Cholesterol, Total 153 100 - 199 mg/dL   Triglycerides 89 0 - 149 mg/dL   HDL 63 >39 mg/dL   VLDL Cholesterol Cal 17 5 - 40 mg/dL   LDL Chol Calc (NIH) 73 0 - 99 mg/dL  TSH  Result Value Ref Range   TSH 1.360 0.450 - 4.500 uIU/mL  VITAMIN D 25 Hydroxy (Vit-D Deficiency, Fractures)  Result Value Ref Range   Vit D, 25-Hydroxy 20.0 (L) 30.0 - 100.0 ng/mL  Hepatitis C antibody  Result Value Ref Range   Hep C Virus Ab <0.1 0.0 - 0.9 s/co ratio  [id number] 11+Oxyco+Alc+Crt-Bund  Result Value Ref Range   Ethanol Negative Cutoff=0.020 %   Amphetamines, Urine Negative Cutoff=1000 ng/mL   Barbiturate Negative Cutoff=200 ng/mL   BENZODIAZ UR QL Negative Cutoff=200 ng/mL   Cannabinoid Quant, Ur Negative Cutoff=50 ng/mL   Cocaine (Metabolite) Negative Cutoff=300 ng/mL   OPIATE  SCREEN URINE Negative Cutoff=300 ng/mL   Oxycodone/Oxymorphone, Urine Negative Cutoff=300 ng/mL   Phencyclidine Negative Cutoff=25 ng/mL   Methadone Screen, Urine Negative Cutoff=300 ng/mL   Propoxyphene Negative Cutoff=300 ng/mL   Meperidine Negative Cutoff=200 ng/mL   Tramadol Negative Cutoff=200 ng/mL   Creatinine 16.0 (L) 20.0 - 300.0 mg/dL   pH, Urine 6.8 4.5 - 8.9  Specific Gravity  Result Value Ref Range   Specific Gravity 1.0043       Assessment & Plan:   Problem List Items Addressed This Visit       Other   Anxiety - Primary    Chronic, improving with Wellbutrin.  Denies SI/HI. Will continue Wellbutrin 300 MG XL daily as is offering benefit.  Continue Klonopin only as needed, recommend using this VERY sparingly with goal to stop in future -- she minimally uses.  UDS up to date and controlled substance contract signed today.  Return for physical in  January.      Controlled substance agreement signed    Signed today with patient 09/06/21 and discussed at length guidelines.      Obesity    BMI 33.81. Recommended eating smaller high protein, low fat meals more frequently and exercising 30 mins a day 5 times a week with a goal of 10-15lb weight loss in the next 3 months. Patient voiced their understanding and motivation to adhere to these recommendations.         Follow up plan: Return in about 6 months (around 03/03/2022) for Annual physical.

## 2021-09-06 NOTE — Assessment & Plan Note (Signed)
Signed today with patient 09/06/21 and discussed at length guidelines.

## 2021-10-15 ENCOUNTER — Telehealth: Payer: No Typology Code available for payment source | Admitting: Nurse Practitioner

## 2021-10-15 ENCOUNTER — Other Ambulatory Visit: Payer: Self-pay | Admitting: Nurse Practitioner

## 2021-10-15 ENCOUNTER — Encounter: Payer: Self-pay | Admitting: Nurse Practitioner

## 2021-10-15 DIAGNOSIS — Z20822 Contact with and (suspected) exposure to covid-19: Secondary | ICD-10-CM | POA: Diagnosis not present

## 2021-10-15 DIAGNOSIS — R509 Fever, unspecified: Secondary | ICD-10-CM

## 2021-10-15 MED ORDER — MOLNUPIRAVIR EUA 200MG CAPSULE: 4.0000 | ORAL_CAPSULE | Freq: Two times a day (BID) | ORAL | 0 refills | Status: AC

## 2021-10-15 NOTE — Patient Instructions (Signed)

## 2021-10-15 NOTE — Progress Notes (Signed)
There were no vitals taken for this visit.   Subjective:    Patient ID: Molly Lynch, female    DOB: 1980/06/19, 41 y.o.   MRN: [id number]  HPI: Molly Lynch is a 41 y.o. female  Chief Complaint  Patient presents with   Covid Exposure    Patient says her husband tested positive on two at home COVID test. Patient says her husband started becoming symptomatic yesterday morning before testing. Patient husband has had symptoms of body-aches, headache and upset stomach. Patient says she is now having sore throat, body-aches and headache. Patient denies having any fever or chills. Patient has taken Ibuprofen. Patient says the headache is located in the back of her skull.    Headache   Generalized Body Aches   Sore Throat   This visit was completed via video visit through MyChart due to the restrictions of the COVID-19 pandemic. All issues as above were discussed and addressed. Physical exam was done as above through visual confirmation on video through MyChart. If it was felt that the patient should be evaluated in the office, they were directed there. The patient verbally consented to this visit. Location of the patient: home Location of the provider: work Those involved with this call:  Provider: Marnee Guarneri, DNP CMA: Irena Reichmann, [place] Desk/Registration: [employer]  Time spent on call:  21 minutes with patient face to face via video conference. More than 50% of this time was spent in counseling and coordination of care. 15 minutes total spent in review of patient's record and preparation of their chart.  I verified patient identity using two factors (patient name and date of birth). Patient consents verbally to being seen via telemedicine visit today.    COVID EXPOSURE Recently exposed to her husband who tested positive at home twice to Covid.  Her symptoms started early this morning.  She is vaccinated, including booster November 2022.   Fever: no -- body aches  and chills Cough: yes, a little bit Shortness of breath: no Wheezing: no Chest pain: no Chest tightness: no Chest congestion: no Nasal congestion: yes Runny nose: no Post nasal drip: no Sneezing: no Sore throat: yes Swollen glands: no Sinus pressure: no Headache: yes in back of neck Face pain: a little Toothache: no Ear pain: none Ear pressure: yes bilateral Eyes red/itching:no Eye drainage/crusting: no  Vomiting: no Rash: no Fatigue: yes Sick contacts: yes Strep contacts: no  Context: stable Recurrent sinusitis: no Relief with OTC cold/cough medications: no  Treatments attempted: Ibuprofen    Relevant past medical, surgical, family and social history reviewed and updated as indicated. Interim medical history since our last visit reviewed. Allergies and medications reviewed and updated.  Review of Systems  Constitutional:  Positive for chills and fatigue. Negative for activity change, appetite change and fever.  HENT:  Positive for congestion, sinus pressure and sore throat. Negative for ear discharge, ear pain, facial swelling, postnasal drip, rhinorrhea, sinus pain, sneezing and voice change.   Eyes:  Negative for pain and visual disturbance.  Respiratory:  Positive for cough. Negative for chest tightness, shortness of breath and wheezing.   Cardiovascular:  Negative for chest pain, palpitations and leg swelling.  Gastrointestinal: Negative.   Musculoskeletal:  Positive for myalgias.  Neurological:  Positive for headaches. Negative for dizziness and numbness.  Psychiatric/Behavioral: Negative.      Per HPI unless specifically indicated above     Objective:    There were no vitals taken for this visit.  Wt Readings from Last 3 Encounters:  09/06/21 193 lb 14.4 oz (88 kg)  02/26/21 193 lb (87.5 kg)  07/20/20 195 lb (88.5 kg)    Physical Exam Vitals and nursing note reviewed.  Constitutional:      General: She is awake. She is not in acute distress.     Appearance: She is well-developed. She is not ill-appearing.  HENT:     Head: Normocephalic.     Right Ear: Hearing normal.     Left Ear: Hearing normal.  Eyes:     General: Lids are normal.        Right eye: No discharge.        Left eye: No discharge.     Conjunctiva/sclera: Conjunctivae normal.  Pulmonary:     Effort: Pulmonary effort is normal. No accessory muscle usage or respiratory distress.  Musculoskeletal:     Cervical back: Normal range of motion.  Neurological:     Mental Status: She is alert and oriented to person, place, and time.  Psychiatric:        Attention and Perception: Attention normal.        Mood and Affect: Mood normal.        Behavior: Behavior normal. Behavior is cooperative.        Thought Content: Thought content normal.        Judgment: Judgment normal.     Results for orders placed or performed in visit on 02/26/21  CBC with Differential/Platelet  Result Value Ref Range   WBC 8.3 3.4 - 10.8 x10E3/uL   RBC 4.88 3.77 - 5.28 x10E6/uL   Hemoglobin 14.7 11.1 - 15.9 g/dL   Hematocrit 83.0 94.0 - 46.6 %   MCV 90 79 - 97 fL   MCH 30.1 26.6 - 33.0 pg   MCHC 33.6 31.5 - 35.7 g/dL   RDW 76.8 08.8 - 11.0 %   Platelets 433 150 - 450 x10E3/uL   Neutrophils 58 Not Estab. %   Lymphs 28 Not Estab. %   Monocytes 9 Not Estab. %   Eos 4 Not Estab. %   Basos 1 Not Estab. %   Neutrophils Absolute 4.8 1.4 - 7.0 x10E3/uL   Lymphocytes Absolute 2.4 0.7 - 3.1 x10E3/uL   Monocytes Absolute 0.8 0.1 - 0.9 x10E3/uL   EOS (ABSOLUTE) 0.3 0.0 - 0.4 x10E3/uL   Basophils Absolute 0.1 0.0 - 0.2 x10E3/uL   Immature Granulocytes 0 Not Estab. %   Immature Grans (Abs) 0.0 0.0 - 0.1 x10E3/uL  Comprehensive metabolic panel  Result Value Ref Range   Glucose 83 70 - 99 mg/dL   BUN 10 6 - 24 mg/dL   Creatinine, Ser 3.15 0.57 - 1.00 mg/dL   eGFR 945 >85 FY/TWK/4.62   BUN/Creatinine Ratio 15 9 - 23   Sodium 138 134 - 144 mmol/L   Potassium 4.5 3.5 - 5.2 mmol/L   Chloride 102  96 - 106 mmol/L   CO2 20 20 - 29 mmol/L   Calcium 9.8 8.7 - 10.2 mg/dL   Total Protein 7.5 6.0 - 8.5 g/dL   Albumin 4.9 (H) 3.8 - 4.8 g/dL   Globulin, Total 2.6 1.5 - 4.5 g/dL   Albumin/Globulin Ratio 1.9 1.2 - 2.2   Bilirubin Total 0.2 0.0 - 1.2 mg/dL   Alkaline Phosphatase 105 44 - 121 IU/L   AST 23 0 - 40 IU/L   ALT 22 0 - 32 IU/L  Lipid Panel w/o Chol/HDL Ratio  Result Value Ref Range  Cholesterol, Total 153 100 - 199 mg/dL   Triglycerides 89 0 - 149 mg/dL   HDL 63 >39 mg/dL   VLDL Cholesterol Cal 17 5 - 40 mg/dL   LDL Chol Calc (NIH) 73 0 - 99 mg/dL  TSH  Result Value Ref Range   TSH 1.360 0.450 - 4.500 uIU/mL  VITAMIN D 25 Hydroxy (Vit-D Deficiency, Fractures)  Result Value Ref Range   Vit D, 25-Hydroxy 20.0 (L) 30.0 - 100.0 ng/mL  Hepatitis C antibody  Result Value Ref Range   Hep C Virus Ab <0.1 0.0 - 0.9 s/co ratio  482500 11+Oxyco+Alc+Crt-Bund  Result Value Ref Range   Ethanol Negative Cutoff=0.020 %   Amphetamines, Urine Negative Cutoff=1000 ng/mL   Barbiturate Negative Cutoff=200 ng/mL   BENZODIAZ UR QL Negative Cutoff=200 ng/mL   Cannabinoid Quant, Ur Negative Cutoff=50 ng/mL   Cocaine (Metabolite) Negative Cutoff=300 ng/mL   OPIATE SCREEN URINE Negative Cutoff=300 ng/mL   Oxycodone/Oxymorphone, Urine Negative Cutoff=300 ng/mL   Phencyclidine Negative Cutoff=25 ng/mL   Methadone Screen, Urine Negative Cutoff=300 ng/mL   Propoxyphene Negative Cutoff=300 ng/mL   Meperidine Negative Cutoff=200 ng/mL   Tramadol Negative Cutoff=200 ng/mL   Creatinine 16.0 (L) 20.0 - 300.0 mg/dL   pH, Urine 6.8 4.5 - 8.9  Specific Gravity  Result Value Ref Range   Specific Gravity 1.0043       Assessment & Plan:   Problem List Items Addressed This Visit       Other   Close exposure to COVID-19 virus - Primary    Exposure to her husband who is positive on testing, her symptoms started this morning.  At this time will obtain Covid testing, high suspicion for positive  based on symptoms and exposure.  Discussed treatment options at length with her.  Will start Ramsey, educated her on this medication + side effects.  Self quarantine for 5 days and then 5 days of masking.  Recommend: - Increased rest - Increasing Fluids - Acetaminophen as needed for fever/pain.  - Salt water gargling, chloraseptic spray and throat lozenges - Mucinex.  - Humidifying the air. Return if worsening or ongoing symptoms.      Relevant Orders   Novel Coronavirus, NAA ([facility])    I discussed the assessment and treatment plan with the patient. The patient was provided an opportunity to ask questions and all were answered. The patient agreed with the plan and demonstrated an understanding of the instructions.   The patient was advised to call back or seek an in-person evaluation if the symptoms worsen or if the condition fails to improve as anticipated.   I provided 21+ minutes of time during this encounter.    Follow up plan: Return if symptoms worsen or fail to improve.

## 2021-10-15 NOTE — Assessment & Plan Note (Signed)
Exposure to her husband who is positive on testing, her symptoms started this morning.  At this time will obtain Covid testing, high suspicion for positive based on symptoms and exposure.  Discussed treatment options at length with her.  Will start Bowlus, educated her on this medication + side effects.  Self quarantine for 5 days and then 5 days of masking.  Recommend: - Increased rest - Increasing Fluids - Acetaminophen as needed for fever/pain.  - Salt water gargling, chloraseptic spray and throat lozenges - Mucinex.  - Humidifying the air. Return if worsening or ongoing symptoms.

## 2021-10-17 LAB — NOVEL CORONAVIRUS, NAA: SARS-CoV-2, NAA: DETECTED — AB

## 2021-10-17 NOTE — Progress Notes (Signed)
Contacted via MyChart   Good afternoon Molly Lynch -- Covid testing did return positive -- continue medication ordered and as we discussed 5 days self quarantine and then 5 days masking.  Any questions?  Do not retest as often this can stay positive for weeks, but you are no longer contagious.   Keep being amazing!!  Thank you for allowing me to participate in your care.  I appreciate you. Kindest regards, Suleima Ohlendorf

## 2021-11-26 ENCOUNTER — Encounter: Payer: Self-pay | Admitting: Nurse Practitioner

## 2021-11-26 MED ORDER — BUPROPION HCL ER (XL) 150 MG PO TB24: 300.0000 mg | ORAL_TABLET | Freq: Every day | ORAL | 4 refills | Status: DC

## 2021-11-26 NOTE — Addendum Note (Signed)
Addended by: Marnee Guarneri T on: 11/26/2021 09:21 AM   Modules accepted: Orders

## 2022-01-13 IMAGING — CT CT ABD-PELV W/O CM
2 of 4 series · 17 of 46 positions shown, 19 images · non-contrast
Comparison: None.

CLINICAL DATA: Right flank pain for 2 weeks, hematuria

EXAM:
CT ABDOMEN AND PELVIS WITHOUT CONTRAST
TECHNIQUE: Multidetector CT imaging of the abdomen and pelvis was performed
following the standard protocol without IV contrast.

[Series 2: axial st · axial · 0.84mm/px · z∈[-1424,-1009]mm · 14 of 91 slices shown, 16 images]
[im 4/91  soft-tissue]
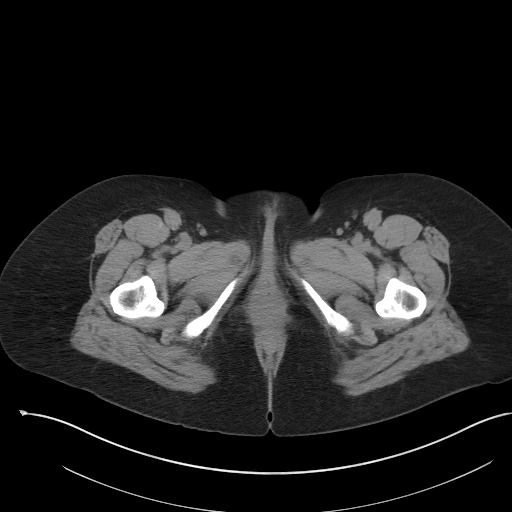
[im 4/91  bone]
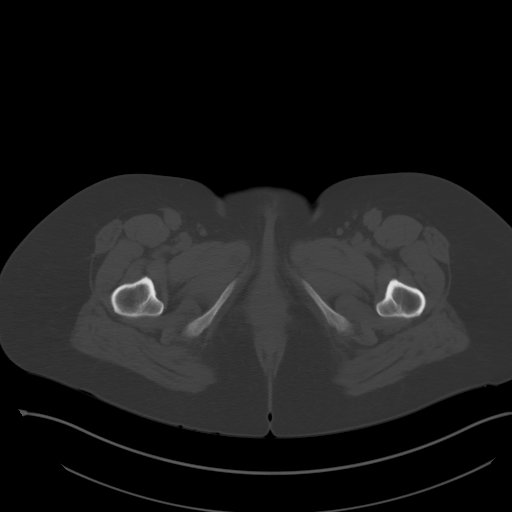
[im 12/91  soft-tissue]
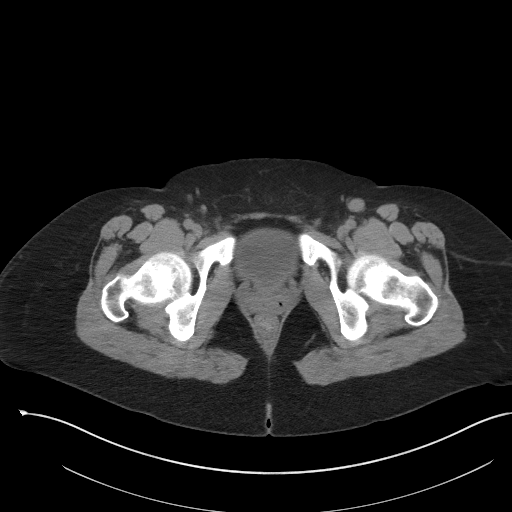
[im 19/91  soft-tissue]
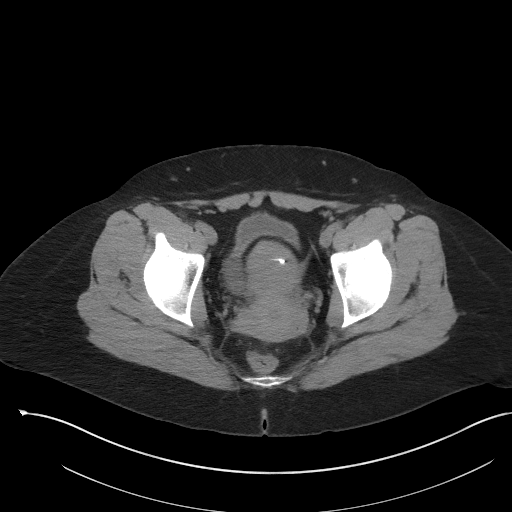
[im 23/91  soft-tissue]
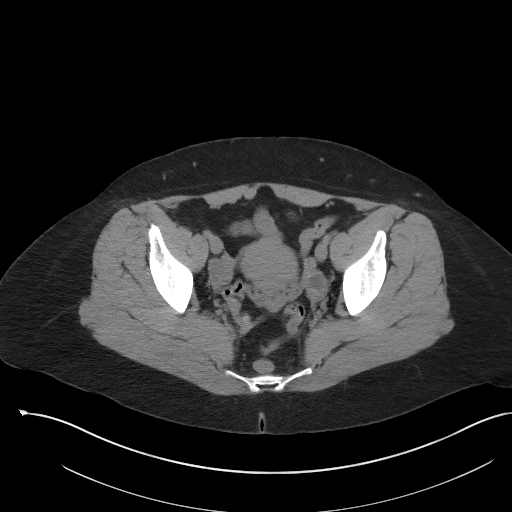
[im 31/91  soft-tissue]
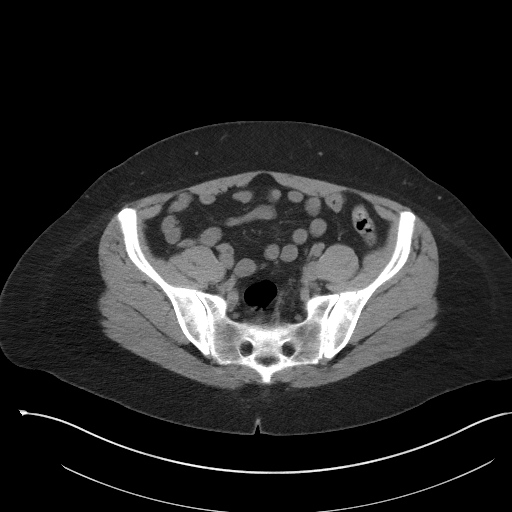
[im 38/91  soft-tissue]
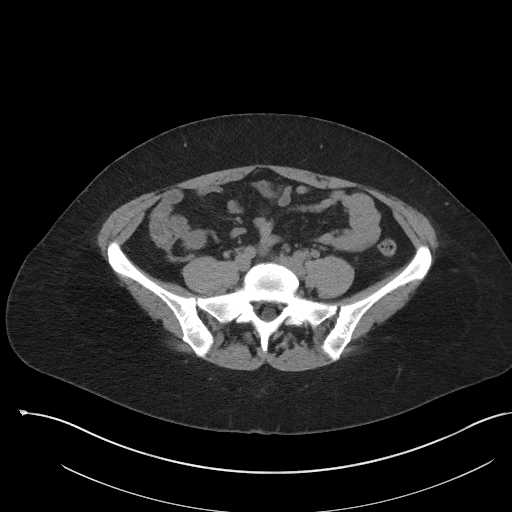
[im 42/91  soft-tissue]
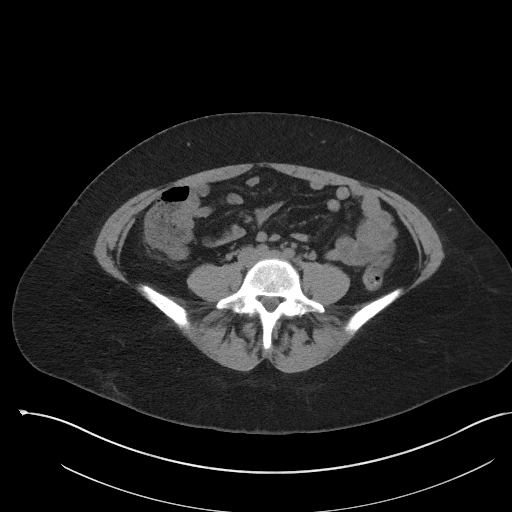
[im 49/91  soft-tissue]
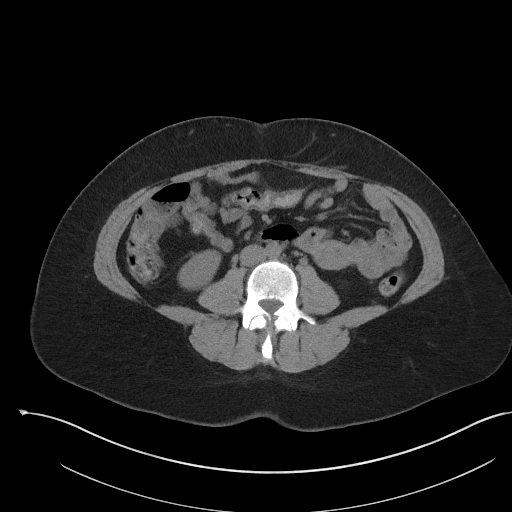
[im 53/91  soft-tissue]
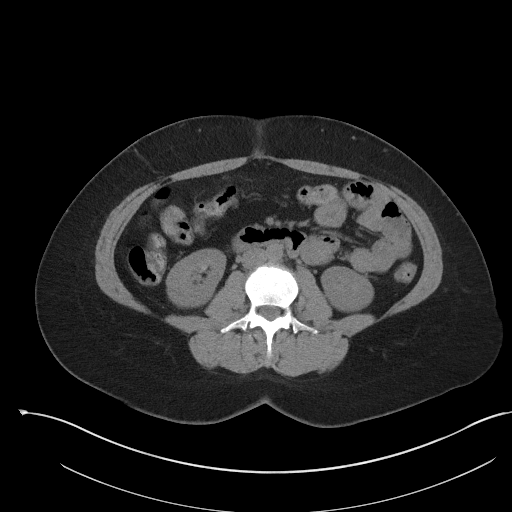
[im 53/91  bone]
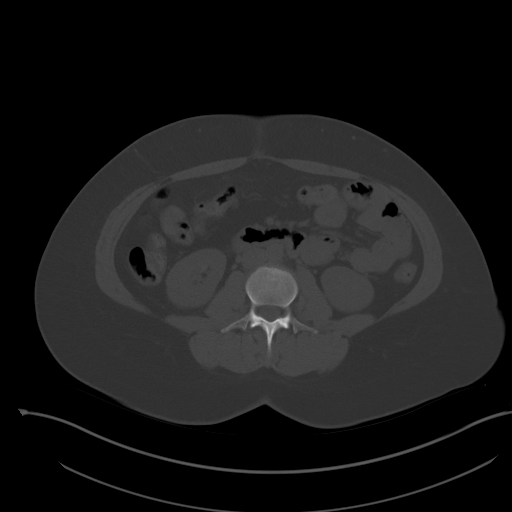
[im 61/91  soft-tissue]
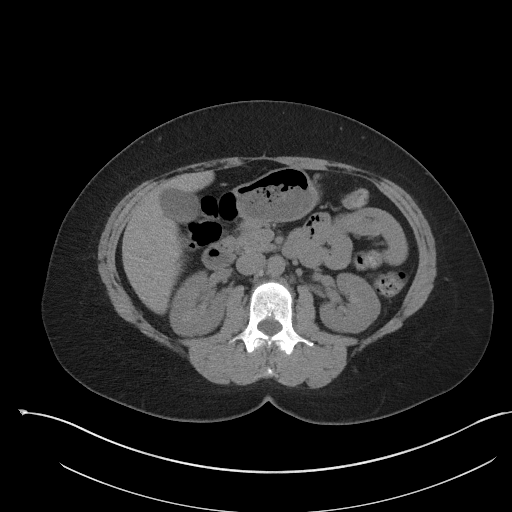
[im 68/91  soft-tissue]
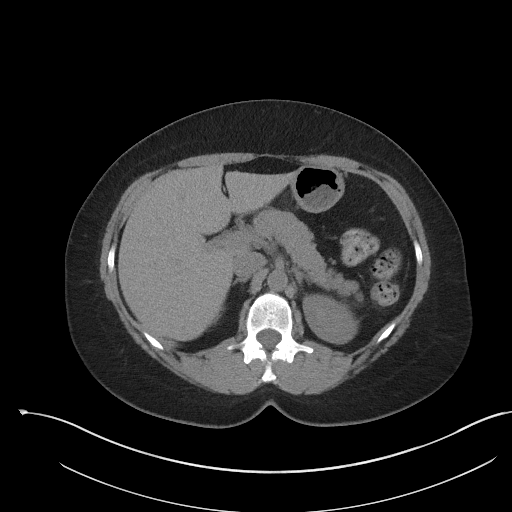
[im 72/91  soft-tissue]
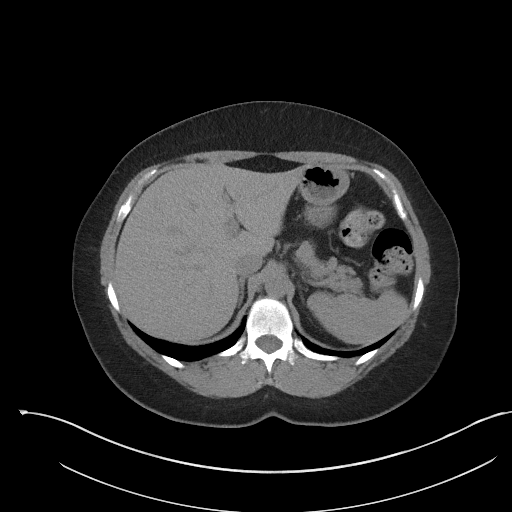
[im 79/91  soft-tissue]
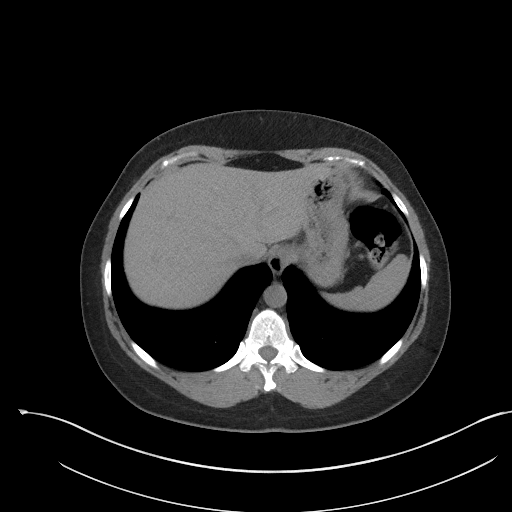
[im 87/91  soft-tissue]
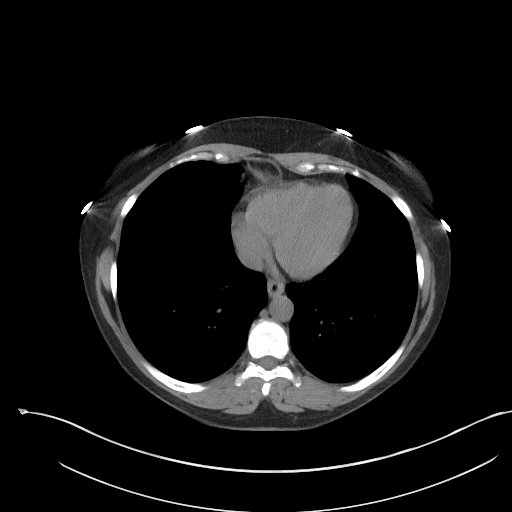

[Series 5: coronal st · coronal · 0.79mm/px · 3 of 87 slices shown]
[im 29/87  soft-tissue]
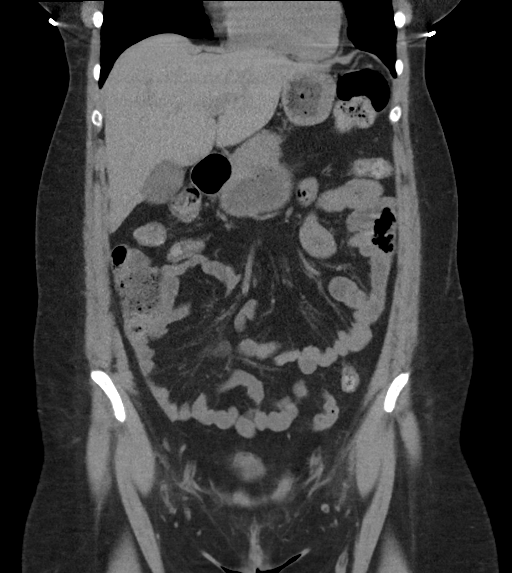
[im 39/87  soft-tissue]
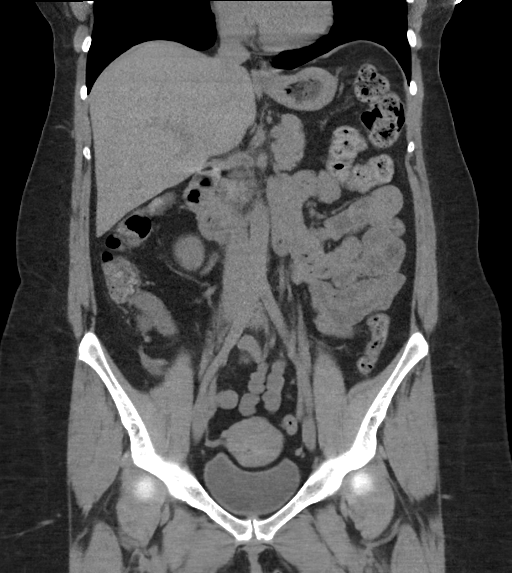
[im 48/87  soft-tissue]
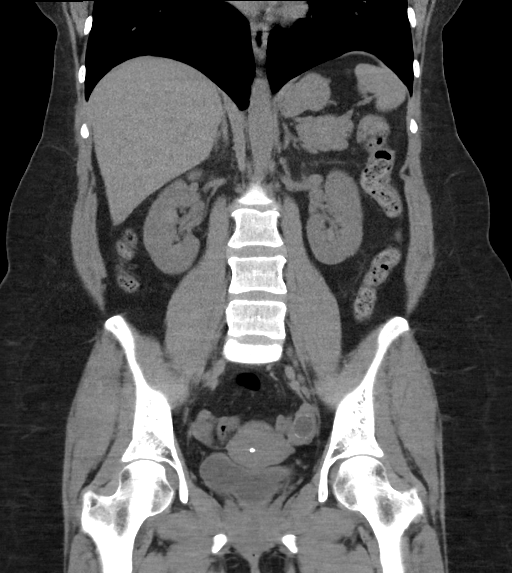

[17 of 46 positions shown; findings below may reference images not displayed]

FINDINGS: Lower chest: No acute pleural or parenchymal lung disease.

Hepatobiliary: No focal liver abnormality is seen. No gallstones,
gallbladder wall thickening, or biliary dilatation.

Pancreas: Unremarkable. No pancreatic ductal dilatation or
surrounding inflammatory changes.

Spleen: Normal in size without focal abnormality.

Adrenals/Urinary Tract: No urinary tract calculi or obstructive
uropathy. The adrenals and bladder are unremarkable.

Stomach/Bowel: No bowel obstruction or ileus. Normal appendix right
lower quadrant. No bowel wall thickening or inflammatory change.

Vascular/Lymphatic: No significant vascular findings. No pathologic
adenopathy within the abdomen or pelvis.

Reproductive: IUD within the endometrial cavity. The uterus is
otherwise unremarkable. Adnexal structures are age-appropriate.

Other: No free fluid or free gas.  No abdominal wall hernia.

Musculoskeletal: No acute or destructive bony lesions. Reconstructed
images demonstrate no additional findings.
IMPRESSION: 1. No urinary tract calculi or obstructive uropathy.
2. No acute intra-abdominal or intrapelvic process.

## 2022-02-27 ENCOUNTER — Encounter: Payer: Self-pay | Admitting: Physician Assistant

## 2022-02-27 ENCOUNTER — Ambulatory Visit: Payer: No Typology Code available for payment source | Admitting: Physician Assistant

## 2022-02-27 VITALS — BP 118/84 | HR 76 | Temp 99.2°F | Ht 63.5 in | Wt 202.3 lb

## 2022-02-27 DIAGNOSIS — M25571 Pain in right ankle and joints of right foot: Secondary | ICD-10-CM

## 2022-02-27 NOTE — Progress Notes (Signed)
Acute Office Visit   Patient: Molly Lynch   DOB: 1980-12-08   42 y.o. Female  MRN: [id number] Visit Date: 02/27/2022  Today's healthcare provider: Dani Gobble Yoseline Andersson, PA-C  Introduced myself to the patient as a Journalist, newspaper and provided education on APPs in clinical practice.    Chief Complaint  Patient presents with   Ankle Pain    Right ankle pain x 1 week   Subjective    Ankle Pain    HPI     Ankle Pain    Additional comments: Right ankle pain x 1 week      Last edited by Jerelene Redden, CMA on 02/27/2022  1:27 PM.      Right Ankle pain Onset: gradual  Duration: last week started to have pain despite interventions  Location: right ankle - lateral aspect of ankle  and dorsum of foot  Pain level and character: sometimes gets up to 7-8/10, but overall 2-3/10 sharp pain  Interventions: Wrapping icing, topical cream like IcyHot, Ibuprofen  Previous injury: a year ago she twisted the same ankle, was seen and evaluated by [facility] - followed their instructions and symptoms seemed to resolve  Recent injury or trauma: no recent known trauma or injury  Alleviating: sitting and resting, icing, topical cream, ibuprofen  Aggravating: walking on it aggravates it to 0-9/73, certain positions and twisting    Medications: Outpatient Medications Prior to Visit  Medication Sig   buPROPion (WELLBUTRIN XL) 150 MG 24 hr tablet Take 2 tablets (300 mg total) by mouth daily.   clonazePAM (KLONOPIN) 0.5 MG tablet Take 0.5 tablets (0.25 mg total) by mouth daily as needed for anxiety.   levonorgestrel (MIRENA) 20 MCG/24HR IUD 1 each by Intrauterine route once.   loratadine (CLARITIN) 10 MG tablet Take 10 mg by mouth daily.   Probiotic Product (PROBIOTIC DAILY PO) Take by mouth 2 (two) times daily.   Vitamin D, Ergocalciferol, (DRISDOL) 1.25 MG (50000 UNIT) CAPS capsule Take 50,000 Units by mouth once a week.   No facility-administered medications prior to visit.    Review of  Systems  Musculoskeletal:  Positive for arthralgias, joint swelling and myalgias.       Objective    BP 118/84   Pulse 76   Temp 99.2 F (37.3 C) (Oral)   Ht 5' 3.5" (1.613 m)   Wt 202 lb 4.8 oz (91.8 kg)   SpO2 99%   BMI 35.27 kg/m    Physical Exam Vitals reviewed.  Constitutional:      General: She is awake.     Appearance: Normal appearance. She is well-developed and well-groomed.  HENT:     Head: Normocephalic and atraumatic.  Musculoskeletal:     Right ankle: No swelling, deformity or ecchymosis. Tenderness present over the lateral malleolus and ATF ligament. Normal range of motion. Anterior drawer test positive. Normal pulse.     Right Achilles Tendon: No tenderness. Thompson's test negative.     Left ankle: Normal. Anterior drawer test negative. Normal pulse.     Left Achilles Tendon: No tenderness. Thompson's test negative.     Right foot: Normal range of motion. Tenderness present.     Left foot: Normal range of motion.     Comments: Pain over fibularis longus and brevis muscle down into tendon  Mild pain with inversion, dorsiflexion and plantarflexion on right foot and ankle   Neurological:     Mental Status: She is alert.  Psychiatric:  Behavior: Behavior is cooperative.       No results found for any visits on 02/27/22.  Assessment & Plan      No follow-ups on file.       Problem List Items Addressed This Visit   None Visit Diagnoses     Pain in joint involving right ankle and foot    -  Primary Acute, new concern Reports pain in right ankle and dorsum of foot  She has hx of previous roll injury to this ankle about a year ago but thought this was resolved with the recommended home measures Will get imaging of ankle and foot given recurrent symptoms Suspect potential ligamentous/ soft tissue injury at this time  Recommend she continue with wrapping, Tylenol, Voltaren gel, Ibuprofen and warm compresses Will provide stretches she can work  through at home to prevent stiffness  If not improving with conservative measures, may need to refer to Ortho or PT for eval and further management  Follow up as needed for persistent or progressing symptoms     Relevant Orders   DG Ankle Complete Right (Completed)        No follow-ups on file.   I, Rodina Pinales E Kalissa Grays, PA-C, have reviewed all documentation for this visit. The documentation on 03/03/22 for the exam, diagnosis, procedures, and orders are all accurate and complete.   Talitha Givens, MHS, PA-C [facility]

## 2022-02-27 NOTE — Patient Instructions (Signed)
  Please go here for your  xray   [place], [place] [address]

## 2022-02-28 ENCOUNTER — Ambulatory Visit
Admission: RE | Admit: 2022-02-28 | Discharge: 2022-02-28 | Disposition: A | Discharge status: Home / Self Care | Payer: No Typology Code available for payment source | Source: Ambulatory Visit | Attending: Physician Assistant

## 2022-02-28 DIAGNOSIS — M25571 Pain in right ankle and joints of right foot: Secondary | ICD-10-CM

## 2022-03-03 NOTE — Progress Notes (Signed)
Your ankle xrays are back They do not show any evidence of fracture, dislocation or joint effusion (excess fluid in the joint) and the soft tissues that were visible did not look damaged or injured. At this time we can try to proceed with the conservative measures you have been doing or I can refer you to PT for evaluation and therapy. Let me know how you'd like to proceed.

## 2022-03-09 DIAGNOSIS — E559 Vitamin D deficiency, unspecified: Secondary | ICD-10-CM | POA: Insufficient documentation

## 2022-03-09 NOTE — Patient Instructions (Incomplete)
Please call to schedule your mammogram and/or bone density: [facility] [place]: [address], [place], [place] [address] Phone: [phone]  [place] [facility] [address] [place],  [place]  [address] Phone: [phone]    Managing Anxiety, Adult After being diagnosed with anxiety, you may be relieved to know why you have felt or behaved a certain way. You may also feel overwhelmed about the treatment ahead and what it will mean for your life. With care and support, you can manage this condition. How to manage lifestyle changes Managing stress and anxiety  Stress is your body's reaction to life changes and events, both good and bad. Most stress will last just a few hours, but stress can be ongoing and can lead to more than just stress. Although stress can play a major role in anxiety, it is not the same as anxiety. Stress is usually caused by something external, such as a deadline, test, or competition. Stress normally passes after the triggering event has ended.  Anxiety is caused by something internal, such as imagining a terrible outcome or worrying that something will go wrong that will devastate you. Anxiety often does not go away even after the triggering event is over, and it can become long-term (chronic) worry. It is important to understand the differences between stress and anxiety and to manage your stress effectively so that it does not lead to an anxious response. Talk with your health care provider or a counselor to learn more about reducing anxiety and stress. He or she may suggest tension reduction techniques, such as: Music therapy. Spend time creating or listening to music that you enjoy and that inspires you. Mindfulness-based meditation. Practice being aware of your normal breaths while not trying to control your breathing. It can be done while sitting or walking. Centering prayer. This involves  focusing on a word, phrase, or sacred image that means something to you and brings you peace. Deep breathing. To do this, expand your stomach and inhale slowly through your nose. Hold your breath for 3-5 seconds. Then exhale slowly, letting your stomach muscles relax. Self-talk. Learn to notice and identify thought patterns that lead to anxiety reactions and change those patterns to thoughts that feel peaceful. Muscle relaxation. Taking time to tense muscles and then relax them. Choose a tension reduction technique that fits your lifestyle and personality. These techniques take time and practice. Set aside 5-15 minutes a day to do them. Therapists can offer counseling and training in these techniques. The training to help with anxiety may be covered by some insurance plans. Other things you can do to manage stress and anxiety include: Keeping a stress diary. This can help you learn what triggers your reaction and then learn ways to manage your response. Thinking about how you react to certain situations. You may not be able to control everything, but you can control your response. Making time for activities that help you relax and not feeling guilty about spending your time in this way. Doing visual imagery. This involves imagining or creating mental pictures to help you relax. Practicing yoga. Through yoga poses, you can lower tension and promote relaxation.  Medicines Medicines can help ease symptoms. Medicines for anxiety include: Antidepressant medicines. These are usually prescribed for long-term daily control. Anti-anxiety medicines. These may be added in severe cases, especially when panic attacks occur. Medicines will be prescribed by a health care provider. When used together, medicines,  psychotherapy, and tension reduction techniques may be the most effective treatment. Relationships Relationships can play a big part in helping you recover. Try to spend more time connecting with trusted  friends and family members. Consider going to couples counseling if you have a partner, taking family education classes, or going to family therapy. Therapy can help you and others better understand your condition. How to recognize changes in your anxiety Everyone responds differently to treatment for anxiety. Recovery from anxiety happens when symptoms decrease and stop interfering with your daily activities at home or work. This may mean that you will start to: Have better concentration and focus. Worry will interfere less in your daily thinking. Sleep better. Be less irritable. Have more energy. Have improved memory. It is also important to recognize when your condition is getting worse. Contact your health care provider if your symptoms interfere with home or work and you feel like your condition is not improving. Follow these instructions at home: Activity Exercise. Adults should do the following: Exercise for at least 150 minutes each week. The exercise should increase your heart rate and make you sweat (moderate-intensity exercise). Strengthening exercises at least twice a week. Get the right amount and quality of sleep. Most adults need 7-9 hours of sleep each night. Lifestyle  Eat a healthy diet that includes plenty of vegetables, fruits, whole grains, low-fat dairy products, and lean protein. Do not eat a lot of foods that are high in fats, added sugars, or salt (sodium). Make choices that simplify your life. Do not use any products that contain nicotine or tobacco. These products include cigarettes, chewing tobacco, and vaping devices, such as e-cigarettes. If you need help quitting, ask your health care provider. Avoid caffeine, alcohol, and certain over-the-counter cold medicines. These may make you feel worse. Ask your pharmacist which medicines to avoid. General instructions Take over-the-counter and prescription medicines only as told by your health care provider. Keep all  follow-up visits. This is important. Where to find support You can get help and support from these sources: Self-help groups. Online and [employer]. A trusted spiritual leader. Couples counseling. Family education classes. Family therapy. Where to find more information You may find that joining a support group helps you deal with your anxiety. The following sources can help you locate counselors or support groups near you: [place]: www.mentalhealthamerica.net Anxiety and Depression Association of [place] (ADAA): https://www.clark.net/ National Alliance on Mental Illness (NAMI): www.nami.org Contact a health care provider if: You have a hard time staying focused or finishing daily tasks. You spend many hours a day feeling worried about everyday life. You become exhausted by worry. You start to have headaches or frequently feel tense. You develop chronic nausea or diarrhea. Get help right away if: You have a racing heart and shortness of breath. You have thoughts of hurting yourself or others. If you ever feel like you may hurt yourself or others, or have thoughts about taking your own life, get help right away. Go to your nearest emergency department or: Call your local emergency services (911 in the [place] Call a suicide crisis helpline, such as the [place] at 640-047-2953 or 988 in the [place]. This is open 24 hours a day in the [place] Text the Crisis Text Line at 762-211-4736 (in the [place].). Summary Taking steps to learn and use tension reduction techniques can help calm you and help prevent triggering an anxiety reaction. When used together, medicines, psychotherapy, and tension reduction techniques may be the  most effective treatment. Family, friends, and partners can play a big part in supporting you. This information is not intended to replace advice given to you by your health care provider. Make sure you discuss any questions you have with  your health care provider. Document Revised: 08/08/2020 Document Reviewed: 05/06/2020 Elsevier Patient Education  [place].

## 2022-03-10 ENCOUNTER — Ambulatory Visit (INDEPENDENT_AMBULATORY_CARE_PROVIDER_SITE_OTHER): Payer: No Typology Code available for payment source | Admitting: Nurse Practitioner

## 2022-03-10 ENCOUNTER — Encounter: Payer: Self-pay | Admitting: Nurse Practitioner

## 2022-03-10 VITALS — BP 120/78 | HR 74 | Temp 98.3°F | Ht 63.5 in | Wt 203.7 lb

## 2022-03-10 DIAGNOSIS — Z808 Family history of malignant neoplasm of other organs or systems: Secondary | ICD-10-CM | POA: Diagnosis not present

## 2022-03-10 DIAGNOSIS — E559 Vitamin D deficiency, unspecified: Secondary | ICD-10-CM | POA: Diagnosis not present

## 2022-03-10 DIAGNOSIS — F17290 Nicotine dependence, other tobacco product, uncomplicated: Secondary | ICD-10-CM

## 2022-03-10 DIAGNOSIS — Z136 Encounter for screening for cardiovascular disorders: Secondary | ICD-10-CM

## 2022-03-10 DIAGNOSIS — Z0000 Encounter for general adult medical examination without abnormal findings: Secondary | ICD-10-CM | POA: Diagnosis not present

## 2022-03-10 DIAGNOSIS — Z1231 Encounter for screening mammogram for malignant neoplasm of breast: Secondary | ICD-10-CM

## 2022-03-10 DIAGNOSIS — F419 Anxiety disorder, unspecified: Secondary | ICD-10-CM

## 2022-03-10 DIAGNOSIS — Z6833 Body mass index (BMI) 33.0-33.9, adult: Secondary | ICD-10-CM

## 2022-03-10 DIAGNOSIS — Z13220 Encounter for screening for lipoid disorders: Secondary | ICD-10-CM

## 2022-03-10 DIAGNOSIS — E6609 Other obesity due to excess calories: Secondary | ICD-10-CM

## 2022-03-10 DIAGNOSIS — Z23 Encounter for immunization: Secondary | ICD-10-CM

## 2022-03-10 DIAGNOSIS — Z803 Family history of malignant neoplasm of breast: Secondary | ICD-10-CM

## 2022-03-10 DIAGNOSIS — F17210 Nicotine dependence, cigarettes, uncomplicated: Secondary | ICD-10-CM

## 2022-03-10 DIAGNOSIS — Z79899 Other long term (current) drug therapy: Secondary | ICD-10-CM | POA: Diagnosis not present

## 2022-03-10 DIAGNOSIS — Z124 Encounter for screening for malignant neoplasm of cervix: Secondary | ICD-10-CM

## 2022-03-10 MED ORDER — CLONAZEPAM 0.5 MG PO TABS: ORAL_TABLET | ORAL | 0 refills | Status: DC

## 2022-03-10 NOTE — Assessment & Plan Note (Signed)
BMI 35.52. Recommended eating smaller high protein, low fat meals more frequently and exercising 30 mins a day 5 times a week with a goal of 10-15lb weight loss in the next 3 months. Patient voiced their understanding and motivation to adhere to these recommendations.

## 2022-03-10 NOTE — Assessment & Plan Note (Signed)
Thyroid glands assessed today. No lumps or mass noted. TSH ordered today.

## 2022-03-10 NOTE — Assessment & Plan Note (Signed)
Recommend she obtain mammogram, order placed today.

## 2022-03-10 NOTE — Assessment & Plan Note (Signed)
Chronic, ongoing.  Recommend she continue supplement and we will adjust as needed.  Check level today.

## 2022-03-10 NOTE — Assessment & Plan Note (Addendum)
Currently has not smoked tobacco in > 1 year --- is currently vaping.  Recommend complete cessation.

## 2022-03-10 NOTE — Progress Notes (Signed)
BP 120/78   Pulse 74   Temp 98.3 F (36.8 C) (Oral)   Ht 5' 3.5" (1.613 m)   Wt 203 lb 11.2 oz (92.4 kg)   LMP 02/14/2022 (Approximate)   SpO2 97%   BMI 35.52 kg/m    Subjective:    Patient ID: Molly Lynch, female    DOB: 22-Mar-1980, 42 y.o.   MRN: [id number]  HPI: Molly Lynch is a 42 y.o. female presenting on 03-14-22 for comprehensive medical examination. Current medical complaints include: none  She currently lives with: spouse Menopausal Symptoms: no  ANXIETY/STRESS Continues on Wellbutrin XL 300 MG daily and Klonopin as needed.  Pt is aware of risks of benzo medication use to include increased sedation, respiratory suppression, falls, dependence and cardiovascular events.  Pt would like to continue treatment as benefit determined to outweigh risk. No recent refills on Klonopin on PDMP review, refill requested today. Duration:stable Anxious mood: no  Excessive worrying: no Irritability: no  Sweating: no Nausea: no Palpitations:no Hyperventilation: no Panic attacks: no Agoraphobia: no  Obscessions/compulsions: no Depressed mood: a little bit, due to season    03/14/22    8:52 AM 02/27/2022    1:35 PM 09/06/2021    2:48 PM 07/11/2021   11:25 AM 02/26/2021    2:29 PM  Depression screen PHQ 2/9  Decreased Interest 0 0 0 0 0  Down, Depressed, Hopeless 0 0 0 0 0  PHQ - 2 Score 0 0 0 0 0  Altered sleeping 1 1 2 2 1  $ Tired, decreased energy 1 1 1 $ 0 1  Change in appetite 1 1 1 1 1  $ Feeling bad or failure about yourself  0 0 0 0 0  Trouble concentrating 1 1 1 2 1  $ Moving slowly or fidgety/restless 0 0 0 1 0  Suicidal thoughts 0 0 0 0 0  PHQ-9 Score 4 4 5 6 4  $ Difficult doing work/chores Somewhat difficult Somewhat difficult Somewhat difficult Somewhat difficult   Anhedonia: no Weight changes: no Insomnia: off and on issues -- wakes up quite a bit in middle of the night Hypersomnia: no Fatigue/loss of energy: a little Feelings of worthlessness:  no Feelings of guilt: no Impaired concentration/indecisiveness: no Suicidal ideations: no  Crying spells: no Recent Stressors/Life Changes: no   Relationship problems: no   Family stress: no     Financial stress: no    Job stress: no   Recent death/loss: no     03/14/2022    8:54 AM 02/27/2022    1:36 PM 09/06/2021    2:49 PM 07/11/2021   11:27 AM  GAD 7 : Generalized Anxiety Score  Nervous, Anxious, on Edge 1 1 1 2  $ Control/stop worrying 0 0 0 1  Worry too much - different things 1 0 0 2  Trouble relaxing 1 1 1 1  $ Restless 1 0 1 1  Easily annoyed or irritable 1 1 1 2  $ Afraid - awful might happen 0 0 0 0  Total GAD 7 Score 5 3 4 9  $ Anxiety Difficulty Somewhat difficult Somewhat difficult Somewhat difficult Somewhat difficult        03/23/2020   11:10 AM 02/26/2021    2:29 PM 02/26/2021    3:37 PM 09/06/2021    2:48 PM 02/27/2022    1:35 PM  Fall Risk  Falls in the past year?  0 1 0 1  Number of falls in past year - Comments   fell last week without  injury    Was there an injury with Fall?  0 0 0 1  Fall Risk Category Calculator  0 1 0 3  Fall Risk Category (Retired)  Low Low Low   (RETIRED) Patient Fall Risk Level Low fall risk Low fall risk Low fall risk Low fall risk   Patient at Risk for Falls Due to  No Fall Risks History of fall(s) No Fall Risks History of fall(s)  Fall risk Follow up  Falls evaluation completed Falls evaluation completed;Education provided Falls evaluation completed     Functional Status Survey: Is the patient deaf or have difficulty hearing?: No Does the patient have difficulty seeing, even when wearing glasses/contacts?: No Does the patient have difficulty concentrating, remembering, or making decisions?: No Does the patient have difficulty walking or climbing stairs?: No Does the patient have difficulty dressing or bathing?: No Does the patient have difficulty doing errands alone such as visiting a doctor's office or shopping?: No   Past Medical  History:  Past Medical History:  Diagnosis Date   Anxiety    Family history of breast cancer    Family history of thyroid cancer    Kidney stones    Migraine    Oral herpes    Pyelonephritis    Surgical History:  Past Surgical History:  Procedure Laterality Date   NO PAST SURGERIES      Medications:  Current Outpatient Medications on File Prior to Visit  Medication Sig   buPROPion (WELLBUTRIN XL) 150 MG 24 hr tablet Take 2 tablets (300 mg total) by mouth daily.   Cholecalciferol (VITAMIN D) 50 MCG (2000 UT) tablet Take 2,000 Units by mouth daily.   levonorgestrel (MIRENA) 20 MCG/24HR IUD 1 each by Intrauterine route once.   loratadine (CLARITIN) 10 MG tablet Take 10 mg by mouth daily.   Probiotic Product (PROBIOTIC DAILY PO) Take by mouth 2 (two) times daily.   No current facility-administered medications on file prior to visit.   Allergies:  No Known Allergies  Social History:  Social History   Socioeconomic History   Marital status: Married    Spouse name: Not on file   Number of children: Not on file   Years of education: Not on file   Highest education level: Not on file  Occupational History   Not on file  Tobacco Use   Smoking status: Former    Packs/day: 0.25    Years: 25.00    Total pack years: 6.25    Types: Cigarettes    Quit date: 03/24/2019    Years since quitting: 2.9   Smokeless tobacco: Never   Tobacco comments:    smoked off and on since the age of 65  Vaping Use   Vaping Use: Some days   Substances: Nicotine  Substance and Sexual Activity   Alcohol use: Yes    Comment: socially   Drug use: No   Sexual activity: Yes    Birth control/protection: I.U.D.  Other Topics Concern   Not on file  Social History Narrative   Not on file   Social Determinants of Health   Financial Resource Strain: Not on file  Food Insecurity: Not on file  Transportation Needs: Not on file  Physical Activity: Not on file  Stress: Not on file  Social  Connections: Not on file  Intimate Partner Violence: Not on file   Social History   Tobacco Use  Smoking Status Former   Packs/day: 0.25   Years: 25.00   Total pack  years: 6.25   Types: Cigarettes   Quit date: 03/24/2019   Years since quitting: 2.9  Smokeless Tobacco Never  Tobacco Comments   smoked off and on since the age of 74   Social History   Substance and Sexual Activity  Alcohol Use Yes   Comment: socially    Family History:  Family History  Problem Relation Age of Onset   Alcohol abuse Mother    Mental illness Mother    Thyroid disease Mother    Cancer Maternal Grandmother 80       breast   Diabetes Maternal Grandmother    Heart disease Maternal Grandmother        Multiple MIs and CABG   Hip fracture Maternal Grandmother    Breast cancer Maternal Grandmother    Hypertension Maternal Grandfather    Colon polyps Maternal Grandfather 23   Diabetes Paternal Grandmother    Cancer Maternal Aunt        Thyroid gland   Breast cancer Maternal Aunt    Clotting disorder Father        Factor II Prothrombin Gene Mutation   Clotting disorder Paternal Uncle        Factor II Prothrombin Gene Mutation    Past medical history, surgical history, medications, allergies, family history and social history reviewed with patient today and changes made to appropriate areas of the chart.   ROS All other ROS negative except what is listed above and in the HPI.      Objective:    BP 120/78   Pulse 74   Temp 98.3 F (36.8 C) (Oral)   Ht 5' 3.5" (1.613 m)   Wt 203 lb 11.2 oz (92.4 kg)   LMP 02/14/2022 (Approximate)   SpO2 97%   BMI 35.52 kg/m   Wt Readings from Last 3 Encounters:  03/10/22 203 lb 11.2 oz (92.4 kg)  02/27/22 202 lb 4.8 oz (91.8 kg)  09/06/21 193 lb 14.4 oz (88 kg)    Physical Exam Vitals and nursing note reviewed. Exam conducted with a chaperone present.  Constitutional:      General: She is awake. She is not in acute distress.    Appearance: She  is well-developed and well-groomed. She is obese. She is not ill-appearing or toxic-appearing.  HENT:     Head: Normocephalic and atraumatic.     Right Ear: Hearing, tympanic membrane, ear canal and external ear normal. No drainage.     Left Ear: Hearing, tympanic membrane, ear canal and external ear normal. No drainage.     Nose: Nose normal.     Right Sinus: No maxillary sinus tenderness or frontal sinus tenderness.     Left Sinus: No maxillary sinus tenderness or frontal sinus tenderness.     Mouth/Throat:     Mouth: Mucous membranes are moist.     Pharynx: Oropharynx is clear. Uvula midline. No pharyngeal swelling, oropharyngeal exudate or posterior oropharyngeal erythema.  Eyes:     General: Lids are normal.        Right eye: No discharge.        Left eye: No discharge.     Extraocular Movements: Extraocular movements intact.     Conjunctiva/sclera: Conjunctivae normal.     Pupils: Pupils are equal, round, and reactive to light.     Visual Fields: Right eye visual fields normal and left eye visual fields normal.  Neck:     Thyroid: No thyromegaly.     Vascular: No carotid bruit.  Trachea: Trachea normal.  Cardiovascular:     Rate and Rhythm: Normal rate and regular rhythm.     Heart sounds: Normal heart sounds. No murmur heard.    No gallop.  Pulmonary:     Effort: Pulmonary effort is normal. No accessory muscle usage or respiratory distress.     Breath sounds: Normal breath sounds.  Chest:  Breasts:    Right: Normal.     Left: Normal.  Abdominal:     General: Bowel sounds are normal.     Palpations: Abdomen is soft. There is no hepatomegaly or splenomegaly.     Tenderness: There is no abdominal tenderness.  Musculoskeletal:        General: Normal range of motion.     Cervical back: Normal range of motion and neck supple.     Right lower leg: No edema.     Left lower leg: No edema.  Lymphadenopathy:     Head:     Right side of head: No submental, submandibular,  tonsillar, preauricular or posterior auricular adenopathy.     Left side of head: No submental, submandibular, tonsillar, preauricular or posterior auricular adenopathy.     Cervical: No cervical adenopathy.     Upper Body:     Right upper body: No supraclavicular, axillary or pectoral adenopathy.     Left upper body: No supraclavicular, axillary or pectoral adenopathy.  Skin:    General: Skin is warm and dry.     Capillary Refill: Capillary refill takes less than 2 seconds.     Findings: No rash.  Neurological:     Mental Status: She is alert and oriented to person, place, and time.     Gait: Gait is intact.     Deep Tendon Reflexes: Reflexes are normal and symmetric.     Reflex Scores:      Brachioradialis reflexes are 2+ on the right side and 2+ on the left side.      Patellar reflexes are 2+ on the right side and 2+ on the left side. Psychiatric:        Attention and Perception: Attention normal.        Mood and Affect: Mood normal.        Speech: Speech normal.        Behavior: Behavior normal. Behavior is cooperative.        Thought Content: Thought content normal.        Judgment: Judgment normal.    Results for orders placed or performed in visit on 10/15/21  Novel Coronavirus, NAA ([facility])   Specimen: Nasopharyngeal(NP) swabs in vial transport medium  Result Value Ref Range   SARS-CoV-2, NAA Detected (A) Not Detected      Assessment & Plan:   Problem List Items Addressed This Visit       Other   Anxiety - Primary    Chronic, stable.  Denies SI/HI. Will continue Wellbutrin 300 MG XL daily as is offering benefit.  Continue Klonopin only as needed, recommend using this VERY sparingly with goal to stop in future -- she minimally uses.  UDS obtained today and controlled substance contract up to date.        Relevant Orders   TSH   Family history of breast cancer    Recommend she obtain mammogram, order placed today.      Family history of thyroid cancer     Thyroid glands assessed today. No lumps or mass noted. TSH ordered today.  Obesity    BMI 35.52. Recommended eating smaller high protein, low fat meals more frequently and exercising 30 mins a day 5 times a week with a goal of 10-15lb weight loss in the next 3 months. Patient voiced their understanding and motivation to adhere to these recommendations.       Relevant Orders   TSH   Vaping nicotine dependence, tobacco product    Currently has not smoked tobacco in > 1 year --- is currently vaping.  Recommend complete cessation.      Vitamin D deficiency    Chronic, ongoing.  Recommend she continue supplement and we will adjust as needed.  Check level today.      Relevant Orders   VITAMIN D 25 Hydroxy (Vit-D Deficiency, Fractures)   Other Visit Diagnoses     High risk medication use       UDS obtained today for Klonopin use.  Discussed with patient,   Relevant Orders   [id number]+Oxyco+Alc+Crt-Bund   Encounter for lipid screening for cardiovascular disease       Lipid panel on labs today.   Relevant Orders   Comprehensive metabolic panel   Lipid Panel w/o Chol/HDL Ratio   Encounter for screening mammogram for malignant neoplasm of breast       Mammogram ordered today   Relevant Orders   MM 3D SCREEN BREAST BILATERAL   Encounter for annual physical exam       Annual physical today with labs and health maintenance reviewed, discussed with patient.   Relevant Orders   CBC with Differential/Platelet   TSH        Follow up plan: Return in about 6 months (around 09/08/2022) for MOOD.   LABORATORY TESTING:  - Pap smear: not today is on cycle  IMMUNIZATIONS:   - Tdap: Tetanus vaccination status reviewed: last tetanus booster within 10 years. - Influenza: Refuses - Pneumovax: Not applicable - Prevnar: Not applicable - COVID: Up to date -- no recent booster - HPV: Not applicable - Shingrix vaccine: Not applicable  SCREENING: -Mammogram: Not Up To Date -- 04/24/20 -  Colonoscopy: Not applicable  - Bone Density: Not applicable  -Hearing Test: Not applicable  -Spirometry: Not applicable   PATIENT COUNSELING:   Advised to take 1 mg of folate supplement per day if capable of pregnancy.   Sexuality: Discussed sexually transmitted diseases, partner selection, use of condoms, avoidance of unintended pregnancy  and contraceptive alternatives.   Advised to avoid cigarette smoking.  I discussed with the patient that most people either abstain from alcohol or drink within safe limits (<=14/week and <=4 drinks/occasion for males, <=7/weeks and <= 3 drinks/occasion for females) and that the risk for alcohol disorders and other health effects rises proportionally with the number of drinks per week and how often a drinker exceeds daily limits.  Discussed cessation/primary prevention of drug use and availability of treatment for abuse.   Diet: Encouraged to adjust caloric intake to maintain  or achieve ideal body weight, to reduce intake of dietary saturated fat and total fat, to limit sodium intake by avoiding high sodium foods and not adding table salt, and to maintain adequate dietary potassium and calcium preferably from fresh fruits, vegetables, and low-fat dairy products.    Stressed the importance of regular exercise  Injury prevention: Discussed safety belts, safety helmets, smoke detector, smoking near bedding or upholstery.   Dental health: Discussed importance of regular tooth brushing, flossing, and dental visits.    NEXT PREVENTATIVE PHYSICAL DUE IN  1 YEAR. Return in about 6 months (around 09/08/2022) for MOOD.

## 2022-03-10 NOTE — Assessment & Plan Note (Signed)
Chronic, stable.  Denies SI/HI. Will continue Wellbutrin 300 MG XL daily as is offering benefit.  Continue Klonopin only as needed, recommend using this VERY sparingly with goal to stop in future -- she minimally uses.  UDS obtained today and controlled substance contract up to date.

## 2022-03-11 LAB — CBC WITH DIFFERENTIAL/PLATELET
Basophils Absolute: 0 10*3/uL (ref 0.0–0.2)
Basos: 1 %
EOS (ABSOLUTE): 0.3 10*3/uL (ref 0.0–0.4)
Eos: 4 %
Hematocrit: 41.5 % (ref 34.0–46.6)
Hemoglobin: 13.5 g/dL (ref 11.1–15.9)
Immature Grans (Abs): 0 10*3/uL (ref 0.0–0.1)
Immature Granulocytes: 0 %
Lymphocytes Absolute: 1.4 10*3/uL (ref 0.7–3.1)
Lymphs: 20 %
MCH: 29.6 pg (ref 26.6–33.0)
MCHC: 32.5 g/dL (ref 31.5–35.7)
MCV: 91 fL (ref 79–97)
Monocytes Absolute: 0.6 10*3/uL (ref 0.1–0.9)
Monocytes: 8 %
Neutrophils Absolute: 4.7 10*3/uL (ref 1.4–7.0)
Neutrophils: 67 %
Platelets: 414 10*3/uL (ref 150–450)
RBC: 4.56 x10E6/uL (ref 3.77–5.28)
RDW: 11.5 % — ABNORMAL LOW (ref 11.7–15.4)
WBC: 7 10*3/uL (ref 3.4–10.8)

## 2022-03-11 LAB — COMPREHENSIVE METABOLIC PANEL
ALT: 27 IU/L (ref 0–32)
AST: 19 IU/L (ref 0–40)
Albumin/Globulin Ratio: 1.8 (ref 1.2–2.2)
Albumin: 4.4 g/dL (ref 3.9–4.9)
Alkaline Phosphatase: 92 IU/L (ref 44–121)
BUN/Creatinine Ratio: 13 (ref 9–23)
BUN: 10 mg/dL (ref 6–24)
Bilirubin Total: 0.2 mg/dL (ref 0.0–1.2)
CO2: 22 mmol/L (ref 20–29)
Calcium: 9.5 mg/dL (ref 8.7–10.2)
Chloride: 105 mmol/L (ref 96–106)
Creatinine, Ser: 0.76 mg/dL (ref 0.57–1.00)
Globulin, Total: 2.5 g/dL (ref 1.5–4.5)
Glucose: 99 mg/dL (ref 70–99)
Potassium: 4.6 mmol/L (ref 3.5–5.2)
Sodium: 138 mmol/L (ref 134–144)
Total Protein: 6.9 g/dL (ref 6.0–8.5)
eGFR: 100 mL/min/{1.73_m2} (ref >59)

## 2022-03-11 LAB — LIPID PANEL W/O CHOL/HDL RATIO
Cholesterol, Total: 142 mg/dL (ref 100–199)
HDL: 59 mg/dL (ref >39)
LDL Chol Calc (NIH): 72 mg/dL (ref 0–99)
Triglycerides: 48 mg/dL (ref 0–149)
VLDL Cholesterol Cal: 11 mg/dL (ref 5–40)

## 2022-03-11 LAB — TSH: TSH: 1.09 u[IU]/mL (ref 0.450–4.500)

## 2022-03-11 LAB — VITAMIN D 25 HYDROXY (VIT D DEFICIENCY, FRACTURES): Vit D, 25-Hydroxy: 24.9 ng/mL — ABNORMAL LOW (ref 30.0–100.0)

## 2022-03-11 NOTE — Progress Notes (Signed)
Contacted via MyChart   Good morning Molly Lynch, your labs have returned: - Kidney function, creatinine and eGFR, remains normal, as is liver function, AST and ALT.  - CBC shows no infection or anemia - TSH, thyroid testing, is normal. - Cholesterol labs are stable.  - Vitamin D remains low, I do recommend you take Vitamin D3 2000 units daily for overall bone health.  Any questions? Keep being amazing!!  Thank you for allowing me to participate in your care.  I appreciate you. Kindest regards, Markiyah Gahm

## 2022-03-13 LAB — DRUG SCREEN 764883 11+OXYCO+ALC+CRT-BUND
Amphetamines, Urine: NEGATIVE ng/mL
BENZODIAZ UR QL: NEGATIVE ng/mL
Barbiturate: NEGATIVE ng/mL
Cocaine (Metabolite): NEGATIVE ng/mL
Creatinine: 32.1 mg/dL (ref 20.0–300.0)
Ethanol: NEGATIVE %
Meperidine: NEGATIVE ng/mL
Methadone Screen, Urine: NEGATIVE ng/mL
OPIATE SCREEN URINE: NEGATIVE ng/mL
Oxycodone/Oxymorphone, Urine: NEGATIVE ng/mL
Phencyclidine: NEGATIVE ng/mL
Propoxyphene: NEGATIVE ng/mL
Tramadol: NEGATIVE ng/mL
pH, Urine: 6.8 (ref 4.5–8.9)

## 2022-03-13 LAB — CANNABINOID CONFIRMATION, UR
CANNABINOIDS: POSITIVE — AB
Carboxy THC GC/MS Conf: 24 ng/mL

## 2022-03-13 NOTE — Progress Notes (Signed)
Contacted via MyChart   Good evening, UDS did come back positive for MJ -- as we discussed we will continue to monitor only:)

## 2022-06-04 ENCOUNTER — Other Ambulatory Visit: Payer: Self-pay | Admitting: Nurse Practitioner

## 2022-06-04 DIAGNOSIS — N6489 Other specified disorders of breast: Secondary | ICD-10-CM

## 2022-06-19 ENCOUNTER — Ambulatory Visit
Admission: RE | Admit: 2022-06-19 | Discharge: 2022-06-19 | Disposition: A | Discharge status: Home / Self Care | Payer: 59 | Source: Ambulatory Visit | Attending: Nurse Practitioner | Admitting: Nurse Practitioner

## 2022-06-19 DIAGNOSIS — N6489 Other specified disorders of breast: Secondary | ICD-10-CM | POA: Insufficient documentation

## 2022-07-22 ENCOUNTER — Encounter: Payer: Self-pay | Admitting: Nurse Practitioner

## 2022-07-23 MED ORDER — BUPROPION HCL ER (XL) 150 MG PO TB24: 300.0000 mg | ORAL_TABLET | Freq: Every day | ORAL | 4 refills | Status: DC

## 2022-07-26 NOTE — Patient Instructions (Signed)
Screening for Sleep Apnea  Sleep apnea is a condition in which breathing pauses or becomes shallow during sleep. Sleep apnea screening is a test to determine if you are at risk for sleep apnea. The test includes a series of questions. It will only takes a few minutes. Your health care provider may ask you to have this test in preparation for surgery or as part of a physical exam. What are the symptoms of sleep apnea? Common symptoms of sleep apnea include: Snoring. Waking up often at night. Daytime sleepiness. Pauses in breathing. Choking or gasping during sleep. Irritability. Forgetfulness. Trouble thinking clearly. Depression. Personality changes. Most people with sleep apnea do not know that they have it. What are the advantages of sleep apnea screening? Getting screened for sleep apnea can help: Ensure your safety. It is important for your health care providers to know whether or not you have sleep apnea, especially if you are having surgery or have other long-term (chronic) health conditions. Improve your health and allow you to get a better night's rest. Restful sleep can help you: Have more energy. Lose weight. Improve high blood pressure. Improve diabetes management. Prevent stroke. Prevent car accidents. What happens during the screening? Screening usually includes being asked a list of questions about your sleep quality. Some questions you may be asked include: Do you snore? Is your sleep restless? Do you have daytime sleepiness? Has a partner or spouse told you that you stop breathing during sleep? Have you had trouble concentrating or memory loss? What is your age? What is your neck circumference? To measure your neck, keep your back straight and gently wrap the tape measure around your neck. Put the tape measure at the middle of your neck, between your chin and collarbone. What is your sex assigned at birth? Do you have or are you being treated for high blood  pressure? If your screening test is positive, you are at risk for the condition. Further testing may be needed to confirm a diagnosis of sleep apnea. Where to find more information You can find screening tools online or at your health care clinic. For more information about sleep apnea screening and healthy sleep, visit these websites: Centers for Disease Control and Prevention: www.cdc.gov American Sleep Apnea Association: www.sleepapnea.org Contact a health care provider if: You think that you may have sleep apnea. Summary Sleep apnea screening can help determine if you are at risk for sleep apnea. It is important for your health care providers to know whether or not you have sleep apnea, especially if you are having surgery or have other chronic health conditions. You may be asked to take a screening test for sleep apnea in preparation for surgery or as part of a physical exam. This information is not intended to replace advice given to you by your health care provider. Make sure you discuss any questions you have with your health care provider. Document Revised: 12/23/2019 Document Reviewed: 12/23/2019 Elsevier Patient Education  2024 [employer].  

## 2022-07-29 ENCOUNTER — Encounter: Payer: Self-pay | Admitting: Nurse Practitioner

## 2022-07-29 ENCOUNTER — Ambulatory Visit: Payer: 59 | Admitting: Nurse Practitioner

## 2022-07-29 VITALS — BP 118/81 | HR 75 | Temp 98.3°F | Ht 63.5 in | Wt 201.8 lb

## 2022-07-29 DIAGNOSIS — Z6835 Body mass index (BMI) 35.0-35.9, adult: Secondary | ICD-10-CM | POA: Diagnosis not present

## 2022-07-29 DIAGNOSIS — N946 Dysmenorrhea, unspecified: Secondary | ICD-10-CM | POA: Diagnosis not present

## 2022-07-29 DIAGNOSIS — E6609 Other obesity due to excess calories: Secondary | ICD-10-CM | POA: Diagnosis not present

## 2022-07-29 DIAGNOSIS — R0683 Snoring: Secondary | ICD-10-CM | POA: Diagnosis not present

## 2022-07-29 NOTE — Assessment & Plan Note (Addendum)
BMI 35.18. Recommended eating smaller high protein, low fat meals more frequently and exercising 30 mins a day 5 times a week with a goal of 10-15lb weight loss in the next 3 months. Patient voiced their understanding and motivation to adhere to these recommendations. Currently insurance does not cover weight loss medications.

## 2022-07-29 NOTE — Progress Notes (Signed)
BP 118/81   Pulse 75   Temp 98.3 F (36.8 C) (Oral)   Ht 5' 3.5" (1.613 m)   Wt 201 lb 12.8 oz (91.5 kg)   LMP 07/13/2022   SpO2 98%   BMI 35.18 kg/m    Subjective:    Patient ID: Molly Lynch, female    DOB: Nov 01, 1980, 42 y.o.   MRN: [id number]  HPI: Molly Lynch is a 42 y.o. female  Chief Complaint  Patient presents with   Snoring   Weight Managements   Mestrual cycle   WEIGHT GAIN Presents today to discuss weight management options.  Per her current plan does not cover dietician or medications for weight loss. Previous attempts at weight loss: yes Complications of obesity: sleep apnea Peak weight: 203 lbs Weight loss goal: 150 - 160 lbs Weight loss to date: 5 pounds Requesting obesity pharmacotherapy: not covered by insurance Current weight loss supplements/medications: no Previous weight loss supplements/meds: no Calories:  trying to stay around 1500 to 1600 calories  MENORRHAGIA Currently has IUD in place, Mirena -- placed 11/12/20 at [facility].  Having worse cramping (practically debilitating) -- started a day before cycle and then continues first 1-2 days of cycle.  Having heavier bleeding for two days.  No migraines, Mirena has helped. Her first Mirena she had great success with, but not second one.   Gravida/Para: 1/1 Duration: uncontrolled Symptom severity: moderate Hot flashes: yes Night sweats: yes Sleep disturbances: yes Vaginal dryness: no Dyspareunia:no Decreased libido: no Emotional lability: yes Stress incontinence:  occasional with coughing and sneezing Average interval between menses: 4 weeks Length of menses: 5 days Flow: heavy at beginning -- tampons and pads Dysmenorrhea: yes GYN surgery: none Absolute Contraindications to Hormonal Therapy:     Undiagnosed vaginal bleeding: no    Breast cancer: yes    Endometrial cancer: no    Coronary disease: no    Cerebrovascular disease: no    Venous thromboembolic disease: no    SNORING Ongoing issue, has been working on weight loss for this.  We discussed sleep study at visit in 2023, but she had wanted to work on health changes. Last sleep study: none Treatments attempted: attempting weight loss + mouth guard Wakes feeling refreshed: about 1/2 the time Daytime hypersomnolence:  varies each day Fatigue:  varies each day Insomnia:  yes -- does not stay asleep Good sleep hygiene:  yes Difficulty falling asleep:  no Difficulty staying asleep:  yes Snoring bothers bed partner:  yes Observed apnea by bed partner: yes Obesity:  yes Hypertension: no  Pulmonary hypertension:  no Coronary artery disease:  no  Relevant past medical, surgical, family and social history reviewed and updated as indicated. Interim medical history since our last visit reviewed. Allergies and medications reviewed and updated.  Review of Systems  Constitutional:  Negative for activity change, appetite change, diaphoresis, fatigue and fever.  Respiratory:  Negative for cough, chest tightness, shortness of breath and wheezing.   Cardiovascular:  Negative for chest pain, palpitations and leg swelling.  Gastrointestinal: Negative.   Neurological: Negative.   Psychiatric/Behavioral:  Positive for sleep disturbance. Negative for decreased concentration, self-injury and suicidal ideas. The patient is nervous/anxious.     Per HPI unless specifically indicated above     Objective:    BP 118/81   Pulse 75   Temp 98.3 F (36.8 C) (Oral)   Ht 5' 3.5" (1.613 m)   Wt 201 lb 12.8 oz (91.5 kg)   LMP 07/13/2022  SpO2 98%   BMI 35.18 kg/m   Wt Readings from Last 3 Encounters:  07/29/22 201 lb 12.8 oz (91.5 kg)  03/10/22 203 lb 11.2 oz (92.4 kg)  02/27/22 202 lb 4.8 oz (91.8 kg)    Physical Exam Vitals and nursing note reviewed.  Constitutional:      General: She is awake. She is not in acute distress.    Appearance: She is well-developed and well-groomed. She is obese. She is not  ill-appearing or toxic-appearing.  HENT:     Head: Normocephalic.     Right Ear: Hearing normal.     Left Ear: Hearing normal.  Eyes:     General: Lids are normal.        Right eye: No discharge.        Left eye: No discharge.     Conjunctiva/sclera: Conjunctivae normal.     Pupils: Pupils are equal, round, and reactive to light.  Neck:     Thyroid: No thyromegaly.     Vascular: No carotid bruit.  Cardiovascular:     Rate and Rhythm: Normal rate and regular rhythm.     Heart sounds: Normal heart sounds. No murmur heard.    No gallop.  Pulmonary:     Effort: Pulmonary effort is normal. No accessory muscle usage or respiratory distress.     Breath sounds: Normal breath sounds.  Abdominal:     General: Bowel sounds are normal.     Palpations: Abdomen is soft.  Musculoskeletal:     Cervical back: Normal range of motion and neck supple.     Right lower leg: No edema.     Left lower leg: No edema.  Skin:    General: Skin is warm and dry.  Neurological:     Mental Status: She is alert and oriented to person, place, and time.     Deep Tendon Reflexes: Reflexes are normal and symmetric.     Reflex Scores:      Brachioradialis reflexes are 2+ on the right side and 2+ on the left side.      Patellar reflexes are 2+ on the right side and 2+ on the left side. Psychiatric:        Attention and Perception: Attention normal.        Mood and Affect: Mood normal.        Speech: Speech normal.        Behavior: Behavior normal. Behavior is cooperative.        Thought Content: Thought content normal.     Results for orders placed or performed in visit on 03/10/22  [id number] 11+Oxyco+Alc+Crt-Bund  Result Value Ref Range   Ethanol Negative Cutoff=0.020 %   Amphetamines, Urine Negative Cutoff=1000 ng/mL   Barbiturate Negative Cutoff=200 ng/mL   BENZODIAZ UR QL Negative Cutoff=200 ng/mL   Cannabinoid Quant, Ur See Final Results Cutoff=50 ng/mL   Cocaine (Metabolite) Negative Cutoff=300 ng/mL    OPIATE SCREEN URINE Negative Cutoff=300 ng/mL   Oxycodone/Oxymorphone, Urine Negative Cutoff=300 ng/mL   Phencyclidine Negative Cutoff=25 ng/mL   Methadone Screen, Urine Negative Cutoff=300 ng/mL   Propoxyphene Negative Cutoff=300 ng/mL   Meperidine Negative Cutoff=200 ng/mL   Tramadol Negative Cutoff=200 ng/mL   Creatinine 32.1 20.0 - 300.0 mg/dL   pH, Urine 6.8 4.5 - 8.9  CBC with Differential/Platelet  Result Value Ref Range   WBC 7.0 3.4 - 10.8 x10E3/uL   RBC 4.56 3.77 - 5.28 x10E6/uL   Hemoglobin 13.5 11.1 - 15.9 g/dL   Hematocrit 41.5  34.0 - 46.6 %   MCV 91 79 - 97 fL   MCH 29.6 26.6 - 33.0 pg   MCHC 32.5 31.5 - 35.7 g/dL   RDW 16.1 (L) 09.6 - 04.5 %   Platelets 414 150 - 450 x10E3/uL   Neutrophils 67 Not Estab. %   Lymphs 20 Not Estab. %   Monocytes 8 Not Estab. %   Eos 4 Not Estab. %   Basos 1 Not Estab. %   Neutrophils Absolute 4.7 1.4 - 7.0 x10E3/uL   Lymphocytes Absolute 1.4 0.7 - 3.1 x10E3/uL   Monocytes Absolute 0.6 0.1 - 0.9 x10E3/uL   EOS (ABSOLUTE) 0.3 0.0 - 0.4 x10E3/uL   Basophils Absolute 0.0 0.0 - 0.2 x10E3/uL   Immature Granulocytes 0 Not Estab. %   Immature Grans (Abs) 0.0 0.0 - 0.1 x10E3/uL  Comprehensive metabolic panel  Result Value Ref Range   Glucose 99 70 - 99 mg/dL   BUN 10 6 - 24 mg/dL   Creatinine, Ser 4.09 0.57 - 1.00 mg/dL   eGFR 811 >91 YN/WGN/5.62   BUN/Creatinine Ratio 13 9 - 23   Sodium 138 134 - 144 mmol/L   Potassium 4.6 3.5 - 5.2 mmol/L   Chloride 105 96 - 106 mmol/L   CO2 22 20 - 29 mmol/L   Calcium 9.5 8.7 - 10.2 mg/dL   Total Protein 6.9 6.0 - 8.5 g/dL   Albumin 4.4 3.9 - 4.9 g/dL   Globulin, Total 2.5 1.5 - 4.5 g/dL   Albumin/Globulin Ratio 1.8 1.2 - 2.2   Bilirubin Total 0.2 0.0 - 1.2 mg/dL   Alkaline Phosphatase 92 44 - 121 IU/L   AST 19 0 - 40 IU/L   ALT 27 0 - 32 IU/L  TSH  Result Value Ref Range   TSH 1.090 0.450 - 4.500 uIU/mL  Lipid Panel w/o Chol/HDL Ratio  Result Value Ref Range   Cholesterol, Total 142  100 - 199 mg/dL   Triglycerides 48 0 - 149 mg/dL   HDL 59 >13 mg/dL   VLDL Cholesterol Cal 11 5 - 40 mg/dL   LDL Chol Calc (NIH) 72 0 - 99 mg/dL  VITAMIN D 25 Hydroxy (Vit-D Deficiency, Fractures)  Result Value Ref Range   Vit D, 25-Hydroxy 24.9 (L) 30.0 - 100.0 ng/mL  Cannabinoid Conf, Ur  Result Value Ref Range   CANNABINOIDS Positive (A) Cutoff=50   Carboxy THC GC/MS Conf 24 Cutoff=15 ng/mL      Assessment & Plan:   Problem List Items Addressed This Visit       Genitourinary   Dysmenorrhea    With menstrual changes, heavier cycles.  Suspect perimenopause present based on symptoms.  Currently has Mirena in place, recommend she reach out to GYN to check on Mirena and see if need for new one to be placed due to symptoms she is currently having.  She will call them.        Other   Obesity    BMI 35.18. Recommended eating smaller high protein, low fat meals more frequently and exercising 30 mins a day 5 times a week with a goal of 10-15lb weight loss in the next 3 months. Patient voiced their understanding and motivation to adhere to these recommendations. Currently insurance does not cover weight loss medications.      Snoring - Primary    Ongoing issue, referral for sleep study placed today.  Discussed at length this will offer benefit to long term health and may even assist with  weight loss if sleep apnea present and starts on CPAP.      Relevant Orders   Ambulatory referral to Sleep Studies     Follow up plan: Return for as scheduled on August 13th for follow-up mood.

## 2022-07-29 NOTE — Assessment & Plan Note (Signed)
With menstrual changes, heavier cycles.  Suspect perimenopause present based on symptoms.  Currently has Mirena in place, recommend she reach out to GYN to check on Mirena and see if need for new one to be placed due to symptoms she is currently having.  She will call them.

## 2022-07-29 NOTE — Assessment & Plan Note (Signed)
Ongoing issue, referral for sleep study placed today.  Discussed at length this will offer benefit to long term health and may even assist with weight loss if sleep apnea present and starts on CPAP.

## 2022-09-09 ENCOUNTER — Ambulatory Visit: Payer: 59 | Admitting: Nurse Practitioner

## 2022-09-15 ENCOUNTER — Ambulatory Visit (INDEPENDENT_AMBULATORY_CARE_PROVIDER_SITE_OTHER): Payer: 59 | Admitting: Neurology

## 2022-09-15 ENCOUNTER — Encounter: Payer: Self-pay | Admitting: Neurology

## 2022-09-15 VITALS — BP 121/74 | HR 61 | Ht 63.0 in | Wt 203.0 lb

## 2022-09-15 DIAGNOSIS — R0683 Snoring: Secondary | ICD-10-CM

## 2022-09-15 DIAGNOSIS — G4719 Other hypersomnia: Secondary | ICD-10-CM | POA: Diagnosis not present

## 2022-09-15 DIAGNOSIS — R0681 Apnea, not elsewhere classified: Secondary | ICD-10-CM | POA: Diagnosis not present

## 2022-09-15 DIAGNOSIS — E669 Obesity, unspecified: Secondary | ICD-10-CM

## 2022-09-15 DIAGNOSIS — R351 Nocturia: Secondary | ICD-10-CM

## 2022-09-15 NOTE — Patient Instructions (Signed)

## 2022-09-15 NOTE — Progress Notes (Signed)
Subjective:    Patient ID: Molly Lynch is a 42 y.o. female.  HPI    Huston Foley, MD, PhD [facility] [address], [address] [address] [place], [place] [address]  Dear Dr. Corrie Dandy,   I saw your patient, Molly Lynch, upon your kind request in my neurologic clinic today for initial consultation of her sleep disorder, in particular, concern for underlying obstructive sleep apnea.  The patient is unaccompanied today.  As you know, Ms. Woehr is a 42 year old female with an underlying medical history of migraine headaches, history of kidney stones, anxiety and obesity, who reports snoring and excessive daytime somnolence.  Her Epworth sleepiness score is 10 out of 24, fatigue severity score is 36 out of 63.  She and her husband may not always sleep in the same room as the snoring is disturbing to him.  He has at times noted pauses in her breathing while she is asleep.  She denies recurrent nocturnal or morning headaches but has nocturia about once per average night.  They do have a TV in the bedroom and it tends to be on at night, goes off on a timer.  She is working on weight loss, she has had slow weight gain, particularly after COVID started.  She has been stable lately with her weight.  She lives with her husband and they are 31 year old son lives in the basement.  They have 2 dogs and 2 cats in the household.  She works in an Designer, industrial/product position out of an office.  She quit smoking from 2 and half years ago but does vape nicotine daily.  She drinks alcohol occasionally, up to twice a week.  She drinks caffeine in the form of an energy drink once in the morning and 1 serving of tea during the day typically.  She is not aware of any family history of sleep apnea.  Bedtime is generally around 11 PM and rise time between 6:30 AM and 7 AM during the workweek. I reviewed your office note from 07/29/2022.  Her Past Medical History Is Significant For: Past Medical  History:  Diagnosis Date   Anxiety    Family history of breast cancer    Family history of thyroid cancer    Kidney stones    Migraine    Oral herpes    Pyelonephritis     Her Past Surgical History Is Significant For: Past Surgical History:  Procedure Laterality Date   NO PAST SURGERIES      Her Family History Is Significant For: Family History  Problem Relation Age of Onset   Alcohol abuse Mother    Mental illness Mother    Thyroid disease Mother    Clotting disorder Father        Factor II Prothrombin Gene Mutation   Cancer Maternal Aunt        Thyroid gland   Breast cancer Maternal Aunt    Clotting disorder Paternal Uncle        Factor II Prothrombin Gene Mutation   Cancer Maternal Grandmother 58       breast   Diabetes Maternal Grandmother    Heart disease Maternal Grandmother        Multiple MIs and CABG   Hip fracture Maternal Grandmother    Breast cancer Maternal Grandmother    Hypertension Maternal Grandfather    Colon polyps Maternal Grandfather 35   Diabetes Paternal Grandmother    Sleep apnea Neg Hx     Her  Social History Is Significant For: Social History   Socioeconomic History   Marital status: Married    Spouse name: Not on file   Number of children: Not on file   Years of education: Not on file   Highest education level: GED or equivalent  Occupational History   Not on file  Tobacco Use   Smoking status: Former    Current packs/day: 0.00    Average packs/day: 0.3 packs/day for 25.0 years (6.3 ttl pk-yrs)    Types: Cigarettes    Start date: 03/23/1994    Quit date: 03/24/2019    Years since quitting: 3.4   Smokeless tobacco: Never   Tobacco comments:    smoked off and on since the age of 48    Pt states vapes daily   Vaping Use   Vaping status: Some Days   Substances: Nicotine  Substance and Sexual Activity   Alcohol use: Yes    Comment: occ   Drug use: No   Sexual activity: Yes    Birth control/protection: I.U.D.  Other Topics  Concern   Not on file  Social History Narrative   Not on file   Social Determinants of Health   Financial Resource Strain: Low Risk  (07/28/2022)   Overall Financial Resource Strain (CARDIA)    Difficulty of Paying Living Expenses: Not very hard  Food Insecurity: No Food Insecurity (07/28/2022)   Hunger Vital Sign    Worried About Running Out of Food in the Last Year: Never true    Ran Out of Food in the Last Year: Never true  Transportation Needs: No Transportation Needs (07/28/2022)   PRAPARE - Administrator, Civil Service (Medical): No    Lack of Transportation (Non-Medical): No  Physical Activity: Insufficiently Active (07/28/2022)   Exercise Vital Sign    Days of Exercise per Week: 3 days    Minutes of Exercise per Session: 10 min  Stress: Stress Concern Present (07/28/2022)   [employer] of Occupational Health - Occupational Stress Questionnaire    Feeling of Stress : To some extent  Social Connections: Moderately Isolated (07/28/2022)   Social Connection and Isolation Panel [NHANES]    Frequency of Communication with Friends and Family: Twice a week    Frequency of Social Gatherings with Friends and Family: Once a week    Attends Religious Services: Never    Database administrator or Organizations: No    Attends Engineer, structural: Not on file    Marital Status: Married    Her Allergies Are:  No Known Allergies:   Her Current Medications Are:  Outpatient Encounter Medications as of 09/15/2022  Medication Sig   buPROPion (WELLBUTRIN XL) 150 MG 24 hr tablet Take 2 tablets (300 mg total) by mouth daily.   Cholecalciferol (VITAMIN D) 50 MCG (2000 UT) tablet Take 2,000 Units by mouth daily.   clonazePAM (KLONOPIN) 0.5 MG tablet Take 0.5 tablets (0.25 mg total) by mouth daily as needed for anxiety.   levonorgestrel (MIRENA) 20 MCG/24HR IUD 1 each by Intrauterine route once.   loratadine (CLARITIN) 10 MG tablet Take 10 mg by mouth daily.   Probiotic  Product (PROBIOTIC DAILY PO) Take by mouth 2 (two) times daily.   No facility-administered encounter medications on file as of 09/15/2022.  :   Review of Systems:  Out of a complete 14 point review of systems, all are reviewed and negative with the exception of these symptoms as listed below:  Review of  Systems  Neurological:        Pt here for sleep consult Pt snores, few headaches,fatigue Pt denies hypertension,sleep study,pap machine    ESS: 'FSS:    Objective:  Neurological Exam  Physical Exam Physical Examination:   Vitals:   09/15/22 1513  BP: 121/74  Pulse: 61    General Examination: The patient is a very pleasant 42 y.o. female in no acute distress. She appears well-developed and well-nourished and well groomed.   HEENT: Normocephalic, atraumatic, pupils are equal, round and reactive to light, extraocular tracking is good without limitation to gaze excursion or nystagmus noted. Hearing is grossly intact. Face is symmetric with normal facial animation. Speech is clear with no dysarthria noted. There is no hypophonia. There is no lip, neck/head, jaw or voice tremor. Neck is supple with full range of passive and active motion. There are no carotid bruits on auscultation. Oropharynx exam reveals: mild mouth dryness, good dental hygiene and mild airway crowding, due to small airway entry, Mallampati class II, normal uvula, tonsils about 1+ bilaterally, right side a little bigger than left, minimal overbite noted.  Tongue protrudes centrally and palate elevates symmetrically, neck circumference 14 1/4 inches.  Chest: Clear to auscultation without wheezing, rhonchi or crackles noted.  Heart: S1+S2+0, regular and normal without murmurs, rubs or gallops noted.   Abdomen: Soft, non-tender and non-distended.  Extremities: There is no pitting edema in the distal lower extremities bilaterally.   Skin: Warm and dry without trophic changes noted.   Musculoskeletal: exam reveals no  obvious joint deformities.   Neurologically:  Mental status: The patient is awake, alert and oriented in all 4 spheres. Her immediate and remote memory, attention, language skills and fund of knowledge are appropriate. There is no evidence of aphasia, agnosia, apraxia or anomia. Speech is clear with normal prosody and enunciation. Thought process is linear. Mood is normal and affect is normal.  Cranial nerves II - XII are as described above under HEENT exam.  Motor exam: Normal bulk, strength and tone is noted. There is no obvious action or resting tremor.  Fine motor skills and coordination: grossly intact.  Cerebellar testing: No dysmetria or intention tremor. There is no truncal or gait ataxia.  Sensory exam: intact to light touch in the upper and lower extremities.  Gait, station and balance: She stands easily. No veering to one side is noted. No leaning to one side is noted. Posture is age-appropriate and stance is narrow based. Gait shows normal stride length and normal pace. No problems turning are noted.   Assessment and Plan:   In summary, TOMIKO CARACCIOLO is a very pleasant 42 y.o.-year old female with an underlying medical history of migraine headaches, history of kidney stones, anxiety and obesity, whose history and physical exam are concerning for sleep disordered breathing, particularly obstructive sleep apnea (OSA).  While a laboratory attended sleep study is typically considered "gold standard" for evaluation of sleep disordered breathing, we mutually agreed to proceed with a home sleep test at this time.   I had a long chat with the patient about my findings and the diagnosis of sleep apnea, particularly OSA, its prognosis and treatment options. We talked about medical/conservative treatments, surgical interventions and non-pharmacological approaches for symptom control. I explained, in particular, the risks and ramifications of untreated moderate to severe OSA, especially with  respect to developing cardiovascular disease down the road, including congestive heart failure (CHF), difficult to treat hypertension, cardiac arrhythmias (particularly A-fib), neurovascular complications including TIA,  stroke and dementia. Even type 2 diabetes has, in part, been linked to untreated OSA. Symptoms of untreated OSA may include (but may not be limited to) daytime sleepiness, nocturia (i.e. frequent nighttime urination), memory problems, mood irritability and suboptimally controlled or worsening mood disorder such as depression and/or anxiety, lack of energy, lack of motivation, physical discomfort, as well as recurrent headaches, especially morning or nocturnal headaches. We talked about the importance of maintaining a healthy lifestyle and striving for healthy weight.  The importance of complete nicotine cessation was also addressed.  In addition, we talked about the importance of striving for and maintaining good sleep hygiene. I recommended a sleep study at this time. I outlined the differences between a laboratory attended sleep study which is considered more comprehensive and accurate over the option of a home sleep test (HST); the latter may lead to underestimation of sleep disordered breathing in some instances and does not help with diagnosing upper airway resistance syndrome and is not accurate enough to diagnose primary central sleep apnea typically. I outlined possible surgical and non-surgical treatment options of OSA, including the use of a positive airway pressure (PAP) device (i.e. CPAP, AutoPAP/APAP or BiPAP in certain circumstances), a custom-made dental device (aka oral appliance, which would require a referral to a specialist dentist or orthodontist typically, and is generally speaking not considered for patients with full dentures or edentulous state), upper airway surgical options, such as traditional UPPP (which is not considered a first-line treatment) or the Inspire device  (hypoglossal nerve stimulator, which would involve a referral for consultation with an ENT surgeon, after careful selection, following inclusion criteria - also not first-line treatment). I explained the PAP treatment option to the patient in detail, as this is generally considered first-line treatment.  The patient indicated that she would be willing to try PAP therapy, if the need arises. I explained the importance of being compliant with PAP treatment, not only for insurance purposes but primarily to improve patient's symptoms symptoms, and for the patient's long term health benefit, including to reduce Her cardiovascular risks longer-term.    We will pick up our discussion about the next steps and treatment options after testing.  We will keep her posted as to the test results by phone call and/or MyChart messaging where possible.  We will plan to follow-up in sleep clinic accordingly as well.  I answered all her questions today and the patient was in agreement.   I encouraged her to call with any interim questions, concerns, problems or updates or email [place] through MyChart.  Generally speaking, sleep test authorizations may take up to 2 weeks, sometimes less, sometimes longer, the patient is encouraged to get in touch with [place] if they do not hear back from the sleep lab staff directly within the next 2 weeks.  Thank you very much for allowing me to participate in the care of this nice patient. If I can be of any further assistance to you please do not hesitate to call me at [phone].  Sincerely,   Huston Foley, MD, PhD

## 2022-09-21 NOTE — Patient Instructions (Addendum)
When start Contrave I would reduce your Wellbutrin to 150 MG (1/2 tablet) and then when you go up to one tablet twice daily on Contrave stay at 150 MG of your Wellbutrun, then when you increase to 2 tablets every morning and 1 in evening of Contrave you can stop your current Wellbutrin as will be at 270 MG Wellbutrin with the Contrave.  Managing Anxiety, Adult After being diagnosed with anxiety, you may be relieved to know why you have felt or behaved a certain way. You may also feel overwhelmed about the treatment ahead and what it will mean for your life. With care and support, you can manage your anxiety. How to manage lifestyle changes Understanding the difference between stress and anxiety Although stress can play a role in anxiety, it is not the same as anxiety. Stress is your body's reaction to life changes and events, both good and bad. Stress is often caused by something external, such as a deadline, test, or competition. It normally goes away after the event has ended and will last just a few hours. But, stress can be ongoing and can lead to more than just stress. Anxiety is caused by something internal, such as imagining a terrible outcome or worrying that something will go wrong that will greatly upset you. Anxiety often does not go away even after the event is over, and it can become a long-term (chronic) worry. Lowering stress and anxiety Talk with your health care provider or a counselor to learn more about lowering anxiety and stress. They may suggest tension-reduction techniques, such as: Music. Spend time creating or listening to music that you enjoy and that inspires you. Mindfulness-based meditation. Practice being aware of your normal breaths while not trying to control your breathing. It can be done while sitting or walking. Centering prayer. Focus on a word, phrase, or sacred image that means something to you and brings you peace. Deep breathing. Expand your stomach and inhale  slowly through your nose. Hold your breath for 3-5 seconds. Then breathe out slowly, letting your stomach muscles relax. Self-talk. Learn to notice and spot thought patterns that lead to anxiety reactions. Change those patterns to thoughts that feel peaceful. Muscle relaxation. Take time to tense muscles and then relax them. Choose a tension-reduction technique that fits your lifestyle and personality. These techniques take time and practice. Set aside 5-15 minutes a day to do them. Specialized therapists can offer counseling and training in these techniques. The training to help with anxiety may be covered by some insurance plans. Other things you can do to manage stress and anxiety include: Keeping a stress diary. This can help you learn what triggers your reaction and then learn ways to manage your response. Thinking about how you react to certain situations. You may not be able to control everything, but you can control your response. Making time for activities that help you relax and not feeling guilty about spending your time in this way. Doing visual imagery. This involves imagining or creating mental pictures to help you relax. Practicing yoga. Through yoga poses, you can lower tension and relax.  Medicines Medicines for anxiety include: Antidepressant medicines. These are usually prescribed for long-term daily control. Anti-anxiety medicines. These may be added in severe cases, especially when panic attacks occur. When used together, medicines, psychotherapy, and tension-reduction techniques may be the most effective treatment. Relationships Relationships can play a big part in helping you recover. Spend more time connecting with trusted friends and family members. Think about  going to couples counseling if you have a partner, taking family education classes, or going to family therapy. Therapy can help you and others better understand your anxiety. How to recognize changes in your  anxiety Everyone responds differently to treatment for anxiety. Recovery from anxiety happens when symptoms lessen and stop interfering with your daily life at home or work. This may mean that you will start to: Have better concentration and focus. Worry will interfere less in your daily thinking. Sleep better. Be less irritable. Have more energy. Have improved memory. Try to recognize when your condition is getting worse. Contact your provider if your symptoms interfere with home or work and you feel like your condition is not improving. Follow these instructions at home: Activity Exercise. Adults should: Exercise for at least 150 minutes each week. The exercise should increase your heart rate and make you sweat (moderate-intensity exercise). Do strengthening exercises at least twice a week. Get the right amount and quality of sleep. Most adults need 7-9 hours of sleep each night. Lifestyle  Eat a healthy diet that includes plenty of vegetables, fruits, whole grains, low-fat dairy products, and lean protein. Do not eat a lot of foods that are high in fats, added sugars, or salt (sodium). Make choices that simplify your life. Do not use any products that contain nicotine or tobacco. These products include cigarettes, chewing tobacco, and vaping devices, such as e-cigarettes. If you need help quitting, ask your provider. Avoid caffeine, alcohol, and certain over-the-counter cold medicines. These may make you feel worse. Ask your pharmacist which medicines to avoid. General instructions Take over-the-counter and prescription medicines only as told by your provider. Keep all follow-up visits. This is to make sure you are managing your anxiety well or if you need more support. Where to find support You can get help and support from: Self-help groups. Online and [employer]. A trusted spiritual leader. Couples counseling. Family education classes. Family therapy. Where to find  more information You may find that joining a support group helps you deal with your anxiety. The following sources can help you find counselors or support groups near you: Mental Health [place]: mentalhealthamerica.net Anxiety and Depression Association of [place] (ADAA): adaa.org The [employer] on Mental Illness (NAMI): nami.org Contact a health care provider if: You have a hard time staying focused or finishing tasks. You spend many hours a day feeling worried about everyday life. You are very tired because you cannot stop worrying. You start to have headaches or often feel tense. You have chronic nausea or diarrhea. Get help right away if: Your heart feels like it is racing. You have shortness of breath. You have thoughts of hurting yourself or others. Get help right away if you feel like you may hurt yourself or others, or have thoughts about taking your own life. Go to your nearest emergency room or: Call 911. Call the National Suicide Prevention Lifeline at (716)003-3116 or 988. This is open 24 hours a day. Text the Crisis Text Line at 304-539-1854. This information is not intended to replace advice given to you by your health care provider. Make sure you discuss any questions you have with your health care provider. Document Revised: 10/22/2021 Document Reviewed: 05/06/2020 Elsevier Patient Education  2024 [employer].

## 2022-09-26 ENCOUNTER — Ambulatory Visit: Payer: 59 | Admitting: Nurse Practitioner

## 2022-09-26 ENCOUNTER — Encounter: Payer: Self-pay | Admitting: Nurse Practitioner

## 2022-09-26 VITALS — BP 125/78 | HR 75 | Temp 99.0°F | Wt 202.2 lb

## 2022-09-26 DIAGNOSIS — Z23 Encounter for immunization: Secondary | ICD-10-CM

## 2022-09-26 DIAGNOSIS — E6609 Other obesity due to excess calories: Secondary | ICD-10-CM | POA: Diagnosis not present

## 2022-09-26 DIAGNOSIS — Z6835 Body mass index (BMI) 35.0-35.9, adult: Secondary | ICD-10-CM | POA: Diagnosis not present

## 2022-09-26 DIAGNOSIS — F419 Anxiety disorder, unspecified: Secondary | ICD-10-CM | POA: Diagnosis not present

## 2022-09-26 MED ORDER — CONTRAVE 8-90 MG PO TB12: ORAL_TABLET | ORAL | 1 refills | Status: DC

## 2022-09-26 MED ORDER — CLONAZEPAM 0.5 MG PO TABS: ORAL_TABLET | ORAL | 0 refills | Status: DC

## 2022-09-26 NOTE — Progress Notes (Signed)
BP 125/78   Pulse 75   Temp 99 F (37.2 C) (Oral)   Wt 202 lb 3.2 oz (91.7 kg)   SpO2 97%   BMI 35.82 kg/m    Subjective:    Patient ID: Molly Lynch, female    DOB: Dec 01, 1980, 42 y.o.   MRN: [id number]  HPI: Molly Lynch is a 42 y.o. female  Chief Complaint  Patient presents with   Anxiety   WEIGHT GAIN Wishes to start Contrave for weight loss, has found a pharmacy online and would like sent there as will pay out of pocket for this.  Her insurance will not cover weight loss medications. Duration: years Previous attempts at weight loss: yes Complications of obesity: snoring Peak weight: 205 lbs Weight loss goal: 160 lbs Weight loss to date: 1 lbs Requesting obesity pharmacotherapy: yes Current weight loss supplements/medications: no Previous weight loss supplements/meds: no Calories:  1800 calories  ANXIETY/STRESS Continues on Wellbutrin XL 300 MG daily and Klonopin as needed.  Pt is aware of risks of benzo medication use to include increased sedation, respiratory suppression, falls, dependence and cardiovascular events.  Pt would like to continue treatment as benefit determined to outweigh risk. No recent refills on Klonopin on PDMP review, last fill 03/10/22.  Currently seeing marriage therapist with her spouse.   13 year old lives in basement at this time. Duration:stable Anxious mood: occasional  Excessive worrying: yes Irritability:  occasional   Sweating: no Nausea: no Palpitations:no Hyperventilation: no Panic attacks: no Agoraphobia: no  Obscessions/compulsions: no Depressed mood:  occasionally a little blue    09/29/22    2:48 PM 07/29/2022    2:44 PM 03/10/2022    8:52 AM 02/27/2022    1:35 PM 09/06/2021    2:48 PM  Depression screen PHQ 2/9  Decreased Interest 0 0 0 0 0  Down, Depressed, Hopeless 1 1 0 0 0  PHQ - 2 Score 1 1 0 0 0  Altered sleeping 2 1 1 1 2   Tired, decreased energy 1 1 1 1 1   Change in appetite 2 1 1 1 1   Feeling bad  or failure about yourself  0 1 0 0 0  Trouble concentrating 1 1 1 1 1   Moving slowly or fidgety/restless 0 1 0 0 0  Suicidal thoughts 0 0 0 0 0  PHQ-9 Score 7 7 4 4 5   Difficult doing work/chores Somewhat difficult Somewhat difficult Somewhat difficult Somewhat difficult Somewhat difficult  Anhedonia: no Weight changes: no Insomnia: yes hard to stay asleep -- went to sleep study consultation Hypersomnia: no Fatigue/loss of energy: yes Feelings of worthlessness: no Feelings of guilt: yes Impaired concentration/indecisiveness: no Suicidal ideations: no  Crying spells: yes Recent Stressors/Life Changes: yes   Relationship problems: yes   Family stress: no     Financial stress: no    Job stress: no    Recent death/loss: no     Sep 29, 2022    2:48 PM 07/29/2022    2:44 PM 03/10/2022    8:54 AM 02/27/2022    1:36 PM  GAD 7 : Generalized Anxiety Score  Nervous, Anxious, on Edge 1 1 1 1   Control/stop worrying 0 0 0 0  Worry too much - different things 0 1 1 0  Trouble relaxing 1 1 1 1   Restless 1 1 1  0  Easily annoyed or irritable 1 1 1 1   Afraid - awful might happen 0 0 0 0  Total GAD 7 Score 4 5 5  3  Anxiety Difficulty Somewhat difficult Somewhat difficult Somewhat difficult Somewhat difficult   Relevant past medical, surgical, family and social history reviewed and updated as indicated. Interim medical history since our last visit reviewed. Allergies and medications reviewed and updated.  Review of Systems  Constitutional:  Negative for activity change, appetite change, diaphoresis, fatigue and fever.  Respiratory:  Negative for cough, chest tightness and shortness of breath.   Cardiovascular:  Negative for chest pain, palpitations and leg swelling.  Gastrointestinal: Negative.   Neurological: Negative.   Psychiatric/Behavioral:  Positive for sleep disturbance. Negative for self-injury and suicidal ideas. The patient is nervous/anxious.     Per HPI unless specifically indicated  above     Objective:    BP 125/78   Pulse 75   Temp 99 F (37.2 C) (Oral)   Wt 202 lb 3.2 oz (91.7 kg)   SpO2 97%   BMI 35.82 kg/m   Wt Readings from Last 3 Encounters:  09/26/22 202 lb 3.2 oz (91.7 kg)  09/15/22 203 lb (92.1 kg)  07/29/22 201 lb 12.8 oz (91.5 kg)    Physical Exam Vitals and nursing note reviewed.  Constitutional:      General: She is awake. She is not in acute distress.    Appearance: She is well-developed and well-groomed. She is obese. She is not ill-appearing or toxic-appearing.  HENT:     Head: Normocephalic.     Right Ear: Hearing and external ear normal.     Left Ear: Hearing and external ear normal.  Eyes:     General: Lids are normal.        Right eye: No discharge.        Left eye: No discharge.     Conjunctiva/sclera: Conjunctivae normal.     Pupils: Pupils are equal, round, and reactive to light.  Neck:     Thyroid: No thyromegaly.     Vascular: No carotid bruit.  Cardiovascular:     Rate and Rhythm: Normal rate and regular rhythm.     Heart sounds: Normal heart sounds. No murmur heard.    No gallop.  Pulmonary:     Effort: Pulmonary effort is normal. No accessory muscle usage or respiratory distress.     Breath sounds: Normal breath sounds.  Abdominal:     General: Bowel sounds are normal. There is no distension.     Palpations: Abdomen is soft.     Tenderness: There is no abdominal tenderness.  Musculoskeletal:     Cervical back: Normal range of motion and neck supple.     Right lower leg: No edema.     Left lower leg: No edema.  Lymphadenopathy:     Cervical: No cervical adenopathy.  Skin:    General: Skin is warm and dry.  Neurological:     Mental Status: She is alert and oriented to person, place, and time.     Deep Tendon Reflexes: Reflexes are normal and symmetric.     Reflex Scores:      Brachioradialis reflexes are 2+ on the right side and 2+ on the left side.      Patellar reflexes are 2+ on the right side and 2+ on  the left side. Psychiatric:        Attention and Perception: Attention normal.        Mood and Affect: Mood normal.        Speech: Speech normal.        Behavior: Behavior normal. Behavior is cooperative.  Thought Content: Thought content normal.    Results for orders placed or performed in visit on 03/10/22  [id number] 11+Oxyco+Alc+Crt-Bund  Result Value Ref Range   Ethanol Negative Cutoff=0.020 %   Amphetamines, Urine Negative Cutoff=1000 ng/mL   Barbiturate Negative Cutoff=200 ng/mL   BENZODIAZ UR QL Negative Cutoff=200 ng/mL   Cannabinoid Quant, Ur See Final Results Cutoff=50 ng/mL   Cocaine (Metabolite) Negative Cutoff=300 ng/mL   OPIATE SCREEN URINE Negative Cutoff=300 ng/mL   Oxycodone/Oxymorphone, Urine Negative Cutoff=300 ng/mL   Phencyclidine Negative Cutoff=25 ng/mL   Methadone Screen, Urine Negative Cutoff=300 ng/mL   Propoxyphene Negative Cutoff=300 ng/mL   Meperidine Negative Cutoff=200 ng/mL   Tramadol Negative Cutoff=200 ng/mL   Creatinine 32.1 20.0 - 300.0 mg/dL   pH, Urine 6.8 4.5 - 8.9  CBC with Differential/Platelet  Result Value Ref Range   WBC 7.0 3.4 - 10.8 x10E3/uL   RBC 4.56 3.77 - 5.28 x10E6/uL   Hemoglobin 13.5 11.1 - 15.9 g/dL   Hematocrit 62.1 30.8 - 46.6 %   MCV 91 79 - 97 fL   MCH 29.6 26.6 - 33.0 pg   MCHC 32.5 31.5 - 35.7 g/dL   RDW 65.7 (L) 84.6 - 96.2 %   Platelets 414 150 - 450 x10E3/uL   Neutrophils 67 Not Estab. %   Lymphs 20 Not Estab. %   Monocytes 8 Not Estab. %   Eos 4 Not Estab. %   Basos 1 Not Estab. %   Neutrophils Absolute 4.7 1.4 - 7.0 x10E3/uL   Lymphocytes Absolute 1.4 0.7 - 3.1 x10E3/uL   Monocytes Absolute 0.6 0.1 - 0.9 x10E3/uL   EOS (ABSOLUTE) 0.3 0.0 - 0.4 x10E3/uL   Basophils Absolute 0.0 0.0 - 0.2 x10E3/uL   Immature Granulocytes 0 Not Estab. %   Immature Grans (Abs) 0.0 0.0 - 0.1 x10E3/uL  Comprehensive metabolic panel  Result Value Ref Range   Glucose 99 70 - 99 mg/dL   BUN 10 6 - 24 mg/dL   Creatinine,  Ser 9.52 0.57 - 1.00 mg/dL   eGFR 841 >32 GM/WNU/2.72   BUN/Creatinine Ratio 13 9 - 23   Sodium 138 134 - 144 mmol/L   Potassium 4.6 3.5 - 5.2 mmol/L   Chloride 105 96 - 106 mmol/L   CO2 22 20 - 29 mmol/L   Calcium 9.5 8.7 - 10.2 mg/dL   Total Protein 6.9 6.0 - 8.5 g/dL   Albumin 4.4 3.9 - 4.9 g/dL   Globulin, Total 2.5 1.5 - 4.5 g/dL   Albumin/Globulin Ratio 1.8 1.2 - 2.2   Bilirubin Total 0.2 0.0 - 1.2 mg/dL   Alkaline Phosphatase 92 44 - 121 IU/L   AST 19 0 - 40 IU/L   ALT 27 0 - 32 IU/L  TSH  Result Value Ref Range   TSH 1.090 0.450 - 4.500 uIU/mL  Lipid Panel w/o Chol/HDL Ratio  Result Value Ref Range   Cholesterol, Total 142 100 - 199 mg/dL   Triglycerides 48 0 - 149 mg/dL   HDL 59 >53 mg/dL   VLDL Cholesterol Cal 11 5 - 40 mg/dL   LDL Chol Calc (NIH) 72 0 - 99 mg/dL  VITAMIN D 25 Hydroxy (Vit-D Deficiency, Fractures)  Result Value Ref Range   Vit D, 25-Hydroxy 24.9 (L) 30.0 - 100.0 ng/mL  Cannabinoid Conf, Ur  Result Value Ref Range   CANNABINOIDS Positive (A) Cutoff=50   Carboxy THC GC/MS Conf 24 Cutoff=15 ng/mL      Assessment & Plan:   Problem List  Items Addressed This Visit       Other   Anxiety - Primary    Chronic, stable.  Denies SI/HI. Will continue Wellbutrin 300 MG XL daily as is offering benefit.  Continue Klonopin only as needed, recommend using this VERY sparingly with goal to stop in future -- she minimally uses.  UDS due 03/11/23 and controlled substance contract up to date.  When starts Contrave have written instructions on how to reduce Wellbutrin.      Obesity    BMI 35.82. Recommended eating smaller high protein, low fat meals more frequently and exercising 30 mins a day 5 times a week with a goal of 10-15lb weight loss in the next 3 months. Patient voiced their understanding and motivation to adhere to these recommendations. Will send in Contrave to mail order company she requested.  When starts Contrave have written instructions on how to  reduce Wellbutrin.      Relevant Medications   Naltrexone-buPROPion HCl ER (CONTRAVE) 8-90 MG TB12   Other Visit Diagnoses     Need for influenza vaccination       Flu vaccine provided today   Relevant Orders   Flu vaccine trivalent PF, 6mos and older(Flulaval,Afluria,Fluarix,Fluzone) (Completed)        Follow up plan: Return in about 6 months (around 03/27/2023) for Annual physical after 03/11/23.

## 2022-09-26 NOTE — Assessment & Plan Note (Signed)
BMI 35.82. Recommended eating smaller high protein, low fat meals more frequently and exercising 30 mins a day 5 times a week with a goal of 10-15lb weight loss in the next 3 months. Patient voiced their understanding and motivation to adhere to these recommendations. Will send in Contrave to mail order company she requested.  When starts Contrave have written instructions on how to reduce Wellbutrin.

## 2022-09-26 NOTE — Assessment & Plan Note (Addendum)
Chronic, stable.  Denies SI/HI. Will continue Wellbutrin 300 MG XL daily as is offering benefit.  Continue Klonopin only as needed, recommend using this VERY sparingly with goal to stop in future -- she minimally uses.  UDS due 03/11/23 and controlled substance contract up to date.  When starts Contrave have written instructions on how to reduce Wellbutrin.

## 2022-09-30 ENCOUNTER — Telehealth: Payer: Self-pay | Admitting: Neurology

## 2022-09-30 NOTE — Telephone Encounter (Signed)
09/17/22: VM full, no msg left -ag  09/17/22 [employer] no auth req EE

## 2022-11-03 ENCOUNTER — Ambulatory Visit: Payer: 59 | Admitting: Neurology

## 2022-11-03 DIAGNOSIS — G4733 Obstructive sleep apnea (adult) (pediatric): Secondary | ICD-10-CM

## 2022-11-03 DIAGNOSIS — R0683 Snoring: Secondary | ICD-10-CM

## 2022-11-03 DIAGNOSIS — R351 Nocturia: Secondary | ICD-10-CM

## 2022-11-03 DIAGNOSIS — E669 Obesity, unspecified: Secondary | ICD-10-CM

## 2022-11-03 DIAGNOSIS — G4719 Other hypersomnia: Secondary | ICD-10-CM

## 2022-11-03 DIAGNOSIS — R0681 Apnea, not elsewhere classified: Secondary | ICD-10-CM

## 2022-11-06 NOTE — Procedures (Signed)
   [facility]  HOME SLEEP TEST (Watch PAT) REPORT  STUDY DATE: 11/05/2022  DOB: 30-Oct-1980  MRN: [id number]  ORDERING CLINICIAN: Huston Foley, MD, PhD   REFERRING CLINICIAN: Marjie Skiff, NP   CLINICAL INFORMATION/HISTORY: 42 year old female with an underlying medical history of migraine headaches, history of kidney stones, anxiety and obesity, who reports snoring and excessive daytime somnolence.   Epworth sleepiness score: 10/24.  BMI: 35.9 kg/m  FINDINGS:   Sleep Summary:   Total Recording Time (hours, min): 9 hours, 5 min  Total Sleep Time (hours, min):  8 hours, 27 min  Percent REM (%):    12.2%   Respiratory Indices:   Calculated pAHI (per hour):  20.2/hour         REM pAHI:    29.3/hour       NREM pAHI: 18.9/hour  Central pAHI: 0.9/hour  Oxygen Saturation Statistics:    Oxygen Saturation (%) Mean: 93%   Minimum oxygen saturation (%):                 70%   O2 Saturation Range (%): 70 - 98%    O2 Saturation (minutes) <=88%: 0.5 min  Pulse Rate Statistics:   Pulse Mean (bpm):    75/min    Pulse Range (51 - 102/min)   IMPRESSION: OSA (obstructive sleep apnea), moderate  RECOMMENDATION:  This home sleep test demonstrates moderate obstructive sleep apnea with a total AHI of 20.2/hour and O2 nadir of 70%.  Mostly moderate snoring was detected, at times milder, at times in the louder range. Treatment with a positive airway pressure (PAP) device is recommended. The patient will be advised to proceed with an autoPAP titration/trial at home for now. A full night titration study may be considered to optimize treatment settings, monitor proper oxygen saturations and aid with improvement of tolerance and adherence, if needed down the road. Alternative treatment options may include a dental device through dentistry or orthodontics in selected patients or Inspire (hypoglossal nerve stimulator) in carefully selected patients (meeting inclusion  criteria).  Concomitant weight loss is recommended (where clinically appropriate). Please note that untreated obstructive sleep apnea may carry additional perioperative morbidity. Patients with significant obstructive sleep apnea should receive perioperative PAP therapy and the surgeons and particularly the anesthesiologist should be informed of the diagnosis and the severity of the sleep disordered breathing. The patient should be cautioned not to drive, work at heights, or operate dangerous or heavy equipment when tired or sleepy. Review and reiteration of good sleep hygiene measures should be pursued with any patient. Other causes of the patient's symptoms, including circadian rhythm disturbances, an underlying mood disorder, medication effect and/or an underlying medical problem cannot be ruled out based on this test. Clinical correlation is recommended.  The patient and her referring provider will be notified of the test results. The patient will be seen in follow up in sleep clinic at [facility].  I certify that I have reviewed the raw data recording prior to the issuance of this report in accordance with the standards of the American Academy of Sleep Medicine (AASM).    INTERPRETING PHYSICIAN:   Huston Foley, MD, PhD Medical Director, [facility]) Diplomat, ABPN (Neurology and Sleep)   [facility] [address] [place], [place] [address] [phone]

## 2022-11-06 NOTE — Addendum Note (Signed)
Addended by: Huston Foley on: 11/06/2022 06:47 PM   Modules accepted: Orders

## 2022-11-06 NOTE — Progress Notes (Signed)
See procedure note.

## 2022-11-11 ENCOUNTER — Telehealth: Payer: Self-pay | Admitting: *Deleted

## 2022-11-11 NOTE — Telephone Encounter (Signed)
-----   Message from Huston Foley sent at 11/06/2022  6:47 PM EDT ----- Patient referred by PCP, seen by me on 09/15/2022, patient had a HST on 11/05/2022.    Please call and notify the patient that the recent home sleep test showed obstructive sleep apnea in the moderate range. I recommend treatment in the form of autoPAP, which means, that we don't have to bring her in for a sleep study with CPAP, but will let her start using a so called autoPAP machine at home, which is a CPAP-like machine with self-adjusting pressures. We will send the order to a local DME company (of her choice, or as per insurance requirement). The DME representative will fit her with a mask, educate her on how to use the machine, how to put the mask on, etc. I have placed an order in the chart. Please send the order, talk to patient, send report to referring MD. We will need a FU in sleep clinic for 10 weeks post-PAP set up, please arrange that with me or one of our NPs. Also reinforce the need for compliance with treatment. Thanks,   Huston Foley, MD, PhD [facility])

## 2022-11-11 NOTE — Telephone Encounter (Signed)
Patient and discussed her sleep study results. The patient is amenable to proceed with AutoPap therapy.  We discussed the difference between AutoPap and CPAP.  We discussed the insurance compliance requirements which includes using the machine at least 4 hours at night and also being seen in our office between 30 and 90 days after set up.  The patient is scheduled a MyChart video visit for 01/30/2023 at 10:00 AM.  She is aware to join the visit at 9:50 AM.  She did not have a preference for DME company.  Will refer to [employer] and see if they can set her up close to her home in [place].  The patient will watch for a call within 1 week. I provided her with [employer]'s phone number.  Autopap order sent to [employer]. Sleep study report sent to referring provider.

## 2022-11-11 NOTE — Telephone Encounter (Signed)
[employer] confirmed receipt of order.  

## 2022-11-19 ENCOUNTER — Other Ambulatory Visit: Payer: Self-pay | Admitting: Nurse Practitioner

## 2022-11-21 NOTE — Telephone Encounter (Signed)
Requested medication (s) are due for refill today: Yes  Requested medication (s) are on the active medication list: Yes  Last refill:  09/26/22  Future visit scheduled: Yes  Notes to clinic:  Manual review.    Requested Prescriptions  Pending Prescriptions Disp Refills   CONTRAVE 8-90 MG TB12 [Pharmacy Med Name: CONTRAVE ER 8-90 MG TABLET] 120 tablet 0    Sig: Take 1 tablet by mouth daily for 7 days then 1 twice a day for 7 days, then 2 in the am and 1 in the pm for 7 days then take 2 twice daily thereafter.     Off-Protocol Failed - 11/19/2022 11:15 AM      Failed - Medication not assigned to a protocol, review manually.      Passed - Valid encounter within last 12 months    Recent Outpatient Visits           1 month ago Anxiety   [place] [facility] Pa[place], Dysart T, NP   3 months ago Snoring   [place] [facility] [place], Darm[place] T, NP   8 months ago Anxiety   [place] [facility] [place], Cash T, NP   8 months ago Pain in joint involving right ankle and foot   [place] [facility] Mecum, Oswaldo Conroy, PA-C   1 year ago Close exposure to COVID-19 virus   [place] [facility] [place], Dorie Rank, NP       Future Appointments             In 2 months Butch Penny, NP [facility]   In 4 months [facility], Dorie Rank, NP [facility] [facility]

## 2022-12-01 ENCOUNTER — Encounter: Payer: Self-pay | Admitting: Neurology

## 2022-12-03 NOTE — Telephone Encounter (Signed)
New, Doristine Mango, RN; Jacqualin Combes; Kathe Becton Received, thank you!       Previous Messages    ----- Message ----- From: Guy Begin, RN Sent: 12/01/2022  10:01 AM EST To: Kathyrn Sheriff; Kathe Becton; Rojelio Brenner; * Subject: new cpap machine                              Good Morning,  New cpap machine user.  Christyn S. Garms Female, 42 y.o., 26-Dec-1980 Pronouns: she/her/hers MRN: [id number]   Order in EPIC.  Langley Gauss

## 2023-01-26 ENCOUNTER — Encounter: Payer: Self-pay | Admitting: *Deleted

## 2023-01-26 NOTE — Patient Instructions (Signed)

## 2023-01-26 NOTE — Progress Notes (Signed)
 PATIENT: Molly Lynch DOB: 1980/12/16  REASON FOR VISIT: follow up HISTORY FROM: patient  Virtual Visit via Telephone Note  I connected with Molly Lynch on 01/27/23 at 11:00 AM EST by telephone and verified that I am speaking with the correct person using two identifiers.   I discussed the limitations, risks, security and privacy concerns of performing an evaluation and management service by telephone and the availability of in person appointments. I also discussed with the patient that there may be a patient responsible charge related to this service. The patient expressed understanding and agreed to proceed.   History of Present Illness:  01/27/23 ALL: Molly Lynch is a 42 y.o. female here today for follow up for recently diagnosed OSA started on CPAP. She was seen in consult with Dr Buck 08/2022 for excessive daytime sleepiness and witnessed apneic events. HST 10/2022 showed moderate obstructive sleep apnea with a total AHI of 20.2/hour and O2 nadir of 70%. AutoPAP advised. Since, she reports doing very well. She is using CPAP most every night. She has had a night where she fells asleep before starting therapy and a couple of nights of not feeling well. She denies concerns with machine or supplies. She does feel better rested and more energized.      History (copied from Dr Obie previous note)  Dear Dr. Melanie,    I saw your patient, Molly Lynch, upon your kind request in my neurologic clinic today for initial consultation of her sleep disorder, in particular, concern for underlying obstructive sleep apnea.  The patient is unaccompanied today.  As you know, [name] is a 42 year old female with an underlying medical history of migraine headaches, history of kidney stones, anxiety and obesity, who reports snoring and excessive daytime somnolence.  Her Epworth sleepiness score is 10 out of 24, fatigue severity score is 36 out of 63.  She and her husband  may not always sleep in the same room as the snoring is disturbing to him.  He has at times noted pauses in her breathing while she is asleep.  She denies recurrent nocturnal or morning headaches but has nocturia about once per average night.  They do have a TV in the bedroom and it tends to be on at night, goes off on a timer.  She is working on weight loss, she has had slow weight gain, particularly after COVID started.  She has been stable lately with her weight.  She lives with her husband and they are 60 year old son lives in the basement.  They have 2 dogs and 2 cats in the household.  She works in an designer, industrial/product position out of an office.  She quit smoking from 2 and half years ago but does vape nicotine daily.  She drinks alcohol occasionally, up to twice a week.  She drinks caffeine in the form of an energy drink once in the morning and 1 serving of tea during the day typically.  She is not aware of any family history of sleep apnea.  Bedtime is generally around 11 PM and rise time between 6:30 AM and 7 AM during the workweek. I reviewed your office note from 07/29/2022.   Observations/Objective:  Generalized: Well developed, in no acute distress  Mentation: Alert oriented to time, place, history taking. Follows all commands speech and language fluent   Assessment and Plan:  42 y.o. year old female  has a past medical history of Anxiety, Family history of breast cancer, Family history  of thyroid cancer, Kidney stones, Migraine, Oral herpes, and Pyelonephritis. here with    ICD-10-CM   1. OSA on CPAP  G47.33       Molly Lynch is doing well on therapy. Compliance report shows optimal compliance. She was encouraged to use CPAP nightly for at least 4 hours. Risks of untreated sleep apnea reviewed. She will continue healthy lifestyle habits. Follow up with me in 1 year, sooner if needed.   No orders of the defined types were placed in this encounter.   No orders of the defined types were  placed in this encounter.    Follow Up Instructions:  I discussed the assessment and treatment plan with the patient. The patient was provided an opportunity to ask questions and all were answered. The patient agreed with the plan and demonstrated an understanding of the instructions.   The patient was advised to call back or seek an in-person evaluation if the symptoms worsen or if the condition fails to improve as anticipated.  I provided 15 minutes of non-face-to-face time during this encounter. Patient located at their place of residence during Mychart visit. Provider is in the office.    Siearra Amberg, NP

## 2023-01-26 NOTE — Progress Notes (Signed)
 Marland Kitchen

## 2023-01-27 ENCOUNTER — Encounter: Payer: Self-pay | Admitting: Family Medicine

## 2023-01-27 ENCOUNTER — Telehealth: Payer: 59 | Admitting: Family Medicine

## 2023-01-27 DIAGNOSIS — G4733 Obstructive sleep apnea (adult) (pediatric): Secondary | ICD-10-CM

## 2023-01-30 ENCOUNTER — Telehealth: Payer: 59 | Admitting: Adult Health

## 2023-02-10 ENCOUNTER — Encounter: Payer: Self-pay | Admitting: Nurse Practitioner

## 2023-02-13 MED ORDER — BUPROPION HCL ER (XL) 300 MG PO TB24: 300.0000 mg | ORAL_TABLET | Freq: Every day | ORAL | 2 refills | Status: AC

## 2023-03-23 ENCOUNTER — Ambulatory Visit: Payer: 59 | Admitting: Pediatrics

## 2023-03-23 VITALS — BP 121/83 | HR 76 | Temp 98.4°F | Ht 63.0 in | Wt 190.8 lb

## 2023-03-23 DIAGNOSIS — J069 Acute upper respiratory infection, unspecified: Secondary | ICD-10-CM | POA: Diagnosis not present

## 2023-03-23 DIAGNOSIS — Z1330 Encounter for screening examination for mental health and behavioral disorders, unspecified: Secondary | ICD-10-CM

## 2023-03-23 MED ORDER — AZITHROMYCIN 250 MG PO TABS
ORAL_TABLET | ORAL | 0 refills | Status: DC
Start: 2023-03-23 — End: 2023-03-30

## 2023-03-23 MED ORDER — ALBUTEROL SULFATE HFA 108 (90 BASE) MCG/ACT IN AERS
2.0000 | INHALATION_SPRAY | Freq: Four times a day (QID) | RESPIRATORY_TRACT | 0 refills | Status: AC | PRN
Start: 2023-03-23 — End: ?

## 2023-03-23 NOTE — Patient Instructions (Addendum)
 Z pack to start today  Most cold symptoms last up to 2 weeks, but cough can sometimes linger up to 4 weeks.  However if your symtpoms get WORSE - like you develop fevers or get more shortness of breath, then call your clinic as you may need to be evaluated.   Aches and Pains Acetaminophen (Tylenol): 1000mg  ("extra strength" tablets are 500mg , so take 2) every 8 hours if needed  Ibuprofen (Advil/Motrin) 400-800mg  (comes in 200mg  pills OTC, so 2-4 pills) every 8 hours. - Avoid in excess if cardiac blood pressure issues  Sore Throat:  See Aches and Pains meds above, also Sore throat sprays and lozenges may also help.   Cough:  Honey 2 TBS every 4-6 hours if needed.  Robitussin DM syrup or generic equivalent which has (guaifenesin = an expectorant to help you get stuff up + dextromethorphan (DM) = cough supressant). You can also get this in tablet formula (like Mucinex DM or generic equivalent).  If you have asthma or are wheezing and have a tight chest, then albuterol inhaler (Ventolin, ProAir) may be helpful - you need a prescription for this.   Congestion:  oxymetazoline (Afrin) nasal stray: 2 sprays each nostril every 12 hours. Don't use more than 3 days in a row to avoid building a tolerance to it.  Sinus rinse (neti pot) high volume sinus rinse can help open up your sinuses and be helpful, especially if you're having sinus pressure and headaches.   Other:  Umcka (pelargonium sidoides extract) can to shorten cold symptoms (can be hard to find, but [employer] carries it: brand name Umcka ColdCare from [employer]). Works best if you start taking at earliest signs of cold symptoms.  Andrographis paniculata is another herbal remedy with less evidence, but may reduce common cold symptoms in adults.  zinc acetate lozenges >= 80 mg/day reduces duration but not severity of cold symptoms in adults, but it is associated with bad taste and nausea Heated humidified air may reduce cold symptoms, so  try using a humidifier - especially in your bedroom at night.  Stay hydrated! Aim to drink at least 2 liters of water daily.   What doesn't work (but lots of folks think might) Vitamin C: bummer right?! But there's no evidence that high dose vitamin C will help cold symptoms.

## 2023-03-23 NOTE — Progress Notes (Signed)
 Office Visit  BP 121/83 (BP Location: Left Arm, Patient Position: Sitting, Cuff Size: Normal)   Pulse 76   Temp 98.4 F (36.9 C) (Oral)   Ht 5\' 3"  (1.6 m)   Wt 190 lb 12.8 oz (86.5 kg)   SpO2 97%   BMI 33.80 kg/m    Subjective:    Patient ID: Molly Lynch, female    DOB: [birth year], 43 y.o.   MRN: [id number]  HPI: Molly Lynch is a 43 y.o. female  Chief Complaint  Patient presents with   Cough   Sinusitis   Nasal Congestion    Started early Jan.  Dayquil Nyquil  Musinex  It comes and go     Discussed the use of AI scribe software for clinical note transcription with the patient, who gave verbal consent to proceed.  History of Present Illness   Molly Lynch is a 43 year old female who presents with recurrent respiratory symptoms.  Since New Year's, she has experienced recurrent respiratory symptoms, initially presenting with a cough, sore throat, and congestion that improved after a few days. These symptoms have recurred intermittently, with periods of improvement followed by exacerbations. A recent exacerbation occurred after two weekends in [place], resulting in extreme fatigue and a return of symptoms.  The current episode began last Thursday, characterized by significant fatigue and a severe headache on Saturday. She slept extensively from Friday night to Sunday morning, totaling about 26 hours. Despite initial improvement on Sunday morning, fatigue returned by the afternoon. She has a persistent cough, which she describes as the worst symptom, causing soreness in her chest and back. Sinus drainage and occasional nasal congestion are also present, with a mild, deep-seated pain in her left ear starting today. No significant fever, chills, or nausea, although a possible low-grade fever was noted on Saturday.  She is currently taking semaglutide 0.5 mg, which causes some nausea, but she does not believe it is related to her respiratory symptoms. She has  used Mucinex DM intermittently, with the last dose taken yesterday.  She vapes minimally, and her husband smokes, which may contribute to her symptoms. 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 No history of asthma.     Relevant past medical, surgical, family and social history reviewed and updated as indicated. Interim medical history since our last visit reviewed. Allergies and medications reviewed and updated.  ROS per HPI unless specifically indicated above     Objective:    BP 121/83 (BP Location: Left Arm, Patient Position: Sitting, Cuff Size: Normal)   Pulse 76   Temp 98.4 F (36.9 C) (Oral)   Ht 5\' 3"  (1.6 m)   Wt 190 lb 12.8 oz (86.5 kg)   SpO2 97%   BMI 33.80 kg/m   Wt Readings from Last 3 Encounters:  03/23/23 190 lb 12.8 oz (86.5 kg)  09/26/22 202 lb 3.2 oz (91.7 kg)  09/15/22 203 lb (92.1 kg)     Physical Exam Constitutional:      Appearance: Normal appearance.  HENT:     Head: Normocephalic and atraumatic.  Eyes:     Pupils: Pupils are equal, round, and reactive to light.  Cardiovascular:     Rate and Rhythm: Normal rate and regular rhythm.     Pulses: Normal pulses.     Heart sounds: Normal heart sounds.  Pulmonary:     Effort: Pulmonary effort is normal.     Breath sounds: Wheezing  present.     Comments: Faint wheezing in posterior upper lung fields bilaterally with inspiration Abdominal:     General: Abdomen is flat.     Palpations: Abdomen is soft.  Musculoskeletal:        General: Normal range of motion.     Cervical back: Normal range of motion.  Skin:    General: Skin is warm and dry.     Capillary Refill: Capillary refill takes less than 2 seconds.  Neurological:     General: No focal deficit present.     Mental Status: She is alert. Mental status is at baseline.  Psychiatric:        Mood and Affect: Mood normal.        Behavior: Behavior normal.         03/23/2023    1:19 PM  09/26/2022    2:48 PM 07/29/2022    2:44 PM 03/10/2022    8:52 AM 02/27/2022    1:35 PM  Depression screen PHQ 2/9  Decreased Interest 0 0 0 0 0  Down, Depressed, Hopeless 0 1 1 0 0  PHQ - 2 Score 0 1 1 0 0  Altered sleeping 0 2 1 1 1   Tired, decreased energy 1 1 1 1 1   Change in appetite 0 2 1 1 1   Feeling bad or failure about yourself  0 0 1 0 0  Trouble concentrating 1 1 1 1 1   Moving slowly or fidgety/restless 0 0 1 0 0  Suicidal thoughts 0 0 0 0 0  PHQ-9 Score 2 7 7 4 4   Difficult doing work/chores Not difficult at all Somewhat difficult Somewhat difficult Somewhat difficult Somewhat difficult       03/23/2023    1:20 PM 09/26/2022    2:48 PM 07/29/2022    2:44 PM 03/10/2022    8:54 AM  GAD 7 : Generalized Anxiety Score  Nervous, Anxious, on Edge 0 1 1 1   Control/stop worrying 0 0 0 0  Worry too much - different things 0 0 1 1  Trouble relaxing 0 1 1 1   Restless 0 1 1 1   Easily annoyed or irritable 0 1 1 1   Afraid - awful might happen 0 0 0 0  Total GAD 7 Score 0 4 5 5   Anxiety Difficulty Not difficult at all Somewhat difficult Somewhat difficult Somewhat difficult       Assessment & Plan:  Assessment & Plan   Upper respiratory tract infection, unspecified type Patient presents with recurrent episodes of cough, sore throat, and congestion since New Year's. Current episode has been ongoing for five days with significant fatigue and cough. No fever reported. Faint wheezing noted on examination. Possible exposure at work. Due to smoking exposure and vaping, may have some degree of underlying bronchitis/COPD. Suspect atypical PNA after viral illness given persistent symptoms. Consider PFT testing once recurrence given recurrence of symptoms this winter season. -Prescribe Azithromycin (Z-Pak) for suspected atypical pneumonia. -Continue over-the-counter Mucinex DM. -Consider Albuterol inhaler for wheezing. - Has f/u scheduled with PCP next week. -     Azithromycin; Take 2 tablets  on day 1, then 1 tablet daily on days 2 through 5  Dispense: 6 tablet; Refill: 0 -     Albuterol Sulfate HFA; Inhale 2 puffs into the lungs every 6 (six) hours as needed for wheezing or shortness of breath.  Dispense: 8 g; Refill: 0  Encounter for behavioral health screening As part of their intake evaluation, the patient  was screened for depression, anxiety.  PHQ9 SCORE 2, GAD7 SCORE 0. Screening results negative for tested conditions. CTM.   Follow up plan: Return if symptoms worsen or fail to improve.  Jackolyn Confer, MD  Approximately 30 minutes spent on patient encounter today including assessment, counseling, diagnosing, treatment plan development, and charting.

## 2023-03-28 NOTE — Patient Instructions (Signed)
 Be Involved in Caring For Your Health:  Taking Medications When medications are taken as directed, they can greatly improve your health. But if they are not taken as prescribed, they may not work. In some cases, not taking them correctly can be harmful. To help ensure your treatment remains effective and safe, understand your medications and how to take them. Bring your medications to each visit for review by your provider.  Your lab results, notes, and after visit summary will be available on My Chart. We strongly encourage you to use this feature. If lab results are abnormal the clinic will contact you with the appropriate steps. If the clinic does not contact you assume the results are satisfactory. You can always view your results on My Chart. If you have questions regarding your health or results, please contact the clinic during office hours. You can also ask questions on My Chart.  We at [facility] are grateful that you chose [place] to provide your care. We strive to provide evidence-based and compassionate care and are always looking for feedback. If you get a survey from the clinic please complete this so we can hear your opinions.  Healthy Eating, Adult Healthy eating may help you get and keep a healthy body weight, reduce the risk of chronic disease, and live a long and productive life. It is important to follow a healthy eating pattern. Your nutritional and calorie needs should be met mainly by different nutrient-rich foods. What are tips for following this plan? Reading food labels Read labels and choose the following: Reduced or low sodium products. Juices with 100% fruit juice. Foods with low saturated fats (<3 g per serving) and high polyunsaturated and monounsaturated fats. Foods with whole grains, such as whole wheat, cracked wheat, brown rice, and wild rice. Whole grains that are fortified with folic acid. This is recommended for females who are pregnant or who want  to become pregnant. Read labels and do not eat or drink the following: Foods or drinks with added sugars. These include foods that contain brown sugar, corn sweetener, corn syrup, dextrose, fructose, glucose, high-fructose corn syrup, honey, invert sugar, lactose, malt syrup, maltose, molasses, raw sugar, sucrose, trehalose, or turbinado sugar. Limit your intake of added sugars to less than 10% of your total daily calories. Do not eat more than the following amounts of added sugar per day: 6 teaspoons (25 g) for females. 9 teaspoons (38 g) for males. Foods that contain processed or refined starches and grains. Refined grain products, such as white flour, degermed cornmeal, white bread, and white rice. Shopping Choose nutrient-rich snacks, such as vegetables, whole fruits, and nuts. Avoid high-calorie and high-sugar snacks, such as potato chips, fruit snacks, and candy. Use oil-based dressings and spreads on foods instead of solid fats such as butter, margarine, sour cream, or cream cheese. Limit pre-made sauces, mixes, and "instant" products such as flavored rice, instant noodles, and ready-made pasta. Try more plant-protein sources, such as tofu, tempeh, black beans, edamame, lentils, nuts, and seeds. Explore eating plans such as the Mediterranean diet or vegetarian diet. Try heart-healthy dips made with beans and healthy fats like hummus and guacamole. Vegetables go great with these. Cooking Use oil to saut or stir-fry foods instead of solid fats such as butter, margarine, or lard. Try baking, boiling, grilling, or broiling instead of frying. Remove the fatty part of meats before cooking. Steam vegetables in water or broth. Meal planning  At meals, imagine dividing your plate into fourths: One-half of  your plate is fruits and vegetables. One-fourth of your plate is whole grains. One-fourth of your plate is protein, especially lean meats, poultry, eggs, tofu, beans, or nuts. Include  low-fat dairy as part of your daily diet. Lifestyle Choose healthy options in all settings, including home, work, school, restaurants, or stores. Prepare your food safely: Wash your hands after handling raw meats. Where you prepare food, keep surfaces clean by regularly washing with hot, soapy water. Keep raw meats separate from ready-to-eat foods, such as fruits and vegetables. Cook seafood, meat, poultry, and eggs to the recommended temperature. Get a food thermometer. Store foods at safe temperatures. In general: Keep cold foods at 76F (4.4C) or below. Keep hot foods at 176F (60C) or above. Keep your freezer at [facility] (-17.8C) or below. Foods are not safe to eat if they have been between the temperatures of 40-176F (4.4-60C) for more than 2 hours. What foods should I eat? Fruits Aim to eat 1-2 cups of fresh, canned (in natural juice), or frozen fruits each day. One cup of fruit equals 1 small apple, 1 large banana, 8 large strawberries, 1 cup (237 g) canned fruit,  cup (82 g) dried fruit, or 1 cup (240 mL) 100% juice. Vegetables Aim to eat 2-4 cups of fresh and frozen vegetables each day, including different varieties and colors. One cup of vegetables equals 1 cup (91 g) broccoli or cauliflower florets, 2 medium carrots, 2 cups (150 g) raw, leafy greens, 1 large tomato, 1 large bell pepper, 1 large sweet potato, or 1 medium white potato. Grains Aim to eat 5-10 ounce-equivalents of whole grains each day. Examples of 1 ounce-equivalent of grains include 1 slice of bread, 1 cup (40 g) ready-to-eat cereal, 3 cups (24 g) popcorn, or  cup (93 g) cooked rice. Meats and other proteins Try to eat 5-7 ounce-equivalents of protein each day. Examples of 1 ounce-equivalent of protein include 1 egg,  oz nuts (12 almonds, 24 pistachios, or 7 walnut halves), 1/4 cup (90 g) cooked beans, 6 tablespoons (90 g) hummus or 1 tablespoon (16 g) peanut butter. A cut of meat or fish that is the size of a deck  of cards is about 3-4 ounce-equivalents (85 g). Of the protein you eat each week, try to have at least 8 sounce (227 g) of seafood. This is about 2 servings per week. This includes salmon, trout, herring, sardines, and anchovies. Dairy Aim to eat 3 cup-equivalents of fat-free or low-fat dairy each day. Examples of 1 cup-equivalent of dairy include 1 cup (240 mL) milk, 8 ounces (250 g) yogurt, 1 ounces (44 g) natural cheese, or 1 cup (240 mL) fortified soy milk. Fats and oils Aim for about 5 teaspoons (21 g) of fats and oils per day. Choose monounsaturated fats, such as canola and olive oils, mayonnaise made with olive oil or avocado oil, avocados, peanut butter, and most nuts, or polyunsaturated fats, such as sunflower, corn, and soybean oils, walnuts, pine nuts, sesame seeds, sunflower seeds, and flaxseed. Beverages Aim for 6 eight-ounce glasses of water per day. Limit coffee to 3-5 eight-ounce cups per day. Limit caffeinated beverages that have added calories, such as soda and energy drinks. If you drink alcohol: Limit how much you have to: 0-1 drink a day if you are female. 0-2 drinks a day if you are female. Know how much alcohol is in your drink. In the [place] one drink is one 12 oz bottle of beer (355 mL), one 5 oz glass of wine (  148 mL), or one 1 oz glass of hard liquor (44 mL). Seasoning and other foods Try not to add too much salt to your food. Try using herbs and spices instead of salt. Try not to add sugar to food. This information is based on [place] nutrition guidelines. To learn more, visit [employer]. Exact amounts may vary. You may need different amounts. This information is not intended to replace advice given to you by your health care provider. Make sure you discuss any questions you have with your health care provider. Document Revised: 10/14/2021 Document Reviewed: 10/14/2021 Elsevier Patient Education  2024 [employer].

## 2023-03-30 ENCOUNTER — Ambulatory Visit (INDEPENDENT_AMBULATORY_CARE_PROVIDER_SITE_OTHER): Payer: 59 | Admitting: Nurse Practitioner

## 2023-03-30 ENCOUNTER — Encounter: Payer: Self-pay | Admitting: Nurse Practitioner

## 2023-03-30 VITALS — BP 116/77 | HR 66 | Temp 98.1°F | Ht 63.0 in | Wt 189.6 lb

## 2023-03-30 DIAGNOSIS — Z808 Family history of malignant neoplasm of other organs or systems: Secondary | ICD-10-CM

## 2023-03-30 DIAGNOSIS — E66812 Obesity, class 2: Secondary | ICD-10-CM | POA: Diagnosis not present

## 2023-03-30 DIAGNOSIS — Z79899 Other long term (current) drug therapy: Secondary | ICD-10-CM

## 2023-03-30 DIAGNOSIS — G4733 Obstructive sleep apnea (adult) (pediatric): Secondary | ICD-10-CM | POA: Diagnosis not present

## 2023-03-30 DIAGNOSIS — Z30431 Encounter for routine checking of intrauterine contraceptive device: Secondary | ICD-10-CM

## 2023-03-30 DIAGNOSIS — F419 Anxiety disorder, unspecified: Secondary | ICD-10-CM | POA: Diagnosis not present

## 2023-03-30 DIAGNOSIS — E559 Vitamin D deficiency, unspecified: Secondary | ICD-10-CM

## 2023-03-30 DIAGNOSIS — Z13220 Encounter for screening for lipoid disorders: Secondary | ICD-10-CM

## 2023-03-30 DIAGNOSIS — R059 Cough, unspecified: Secondary | ICD-10-CM | POA: Insufficient documentation

## 2023-03-30 DIAGNOSIS — R052 Subacute cough: Secondary | ICD-10-CM | POA: Diagnosis not present

## 2023-03-30 DIAGNOSIS — E6609 Other obesity due to excess calories: Secondary | ICD-10-CM

## 2023-03-30 DIAGNOSIS — Z0000 Encounter for general adult medical examination without abnormal findings: Secondary | ICD-10-CM

## 2023-03-30 DIAGNOSIS — Z136 Encounter for screening for cardiovascular disorders: Secondary | ICD-10-CM

## 2023-03-30 DIAGNOSIS — F17290 Nicotine dependence, other tobacco product, uncomplicated: Secondary | ICD-10-CM

## 2023-03-30 MED ORDER — PREDNISONE 20 MG PO TABS: 40.0000 mg | ORAL_TABLET | Freq: Every day | ORAL | 0 refills | Status: AC

## 2023-03-30 NOTE — Assessment & Plan Note (Signed)
Chronic, ongoing.  Recommend she continue supplement and we will adjust as needed.  Check level today.

## 2023-03-30 NOTE — Assessment & Plan Note (Signed)
 Will call to schedule pap and IUD check -- may need new IUD (refer to recent GYN note) or imaging.  Discussed with patient.

## 2023-03-30 NOTE — Assessment & Plan Note (Signed)
 Ongoing, some mild improvement.  Will add on Prednisone 40 MG daily for 5 days.  Discussed with her if ongoing after Prednisone complete then will send in Augmentin.  Continue at home Mucinex.  If any worsening or ongoing then would consider imaging.

## 2023-03-30 NOTE — Assessment & Plan Note (Signed)
 Diagnosed on 11/11/22.  Continue use of CPAP, recommend 100% use.

## 2023-03-30 NOTE — Progress Notes (Signed)
 BP 116/77 (BP Location: Left Arm, Patient Position: Sitting, Cuff Size: Normal)   Pulse 66   Temp 98.1 F (36.7 C) (Oral)   Ht 5\' 3"  (1.6 m)   Wt 189 lb 9.6 oz (86 kg)   LMP 03/26/2023 (Exact Date)   SpO2 100%   BMI 33.59 kg/m    Subjective:    Patient ID: Molly Lynch, female    DOB: [birth year], 43 y.o.   MRN: [id number]  HPI: Molly Lynch is a 43 y.o. female presenting on 03/30/2023 for comprehensive medical examination. Current medical complaints include: none  She currently lives with: spouse Menopausal Symptoms: no  COUGH Was seen on 03/23/23, given Zpack and inhaler.  It is a lot better, but not 100%.  Occasionally will have fit of cough.  On and off cough for 2 months. Duration: weeks Circumstances of initial development of cough: URI Cough severity: mild Cough description: occasional productive Aggravating factors: worse in the middle of the day Alleviating factors: Zpack, Albuterl, Mucinex Status:  fluctuating Treatments attempted: as above Wheezing: no Shortness of breath: occasional Chest pain: no Chest tightness:no Nasal congestion: yes Runny nose: yes Postnasal drip: yes Frequent throat clearing or swallowing: no Hemoptysis: no Fevers: no Night sweats: no Weight loss: no Heartburn: no Recent foreign travel: no Tuberculosis contacts: no   WEIGHT GAIN Taking Wegovy 0.5 MG weekly and is losing weight with this.   Duration: years Previous attempts at weight loss: yes Complications of obesity:  Peak weight: 206 lbs Weight loss goal: 160 lbs -- goal is 135 lbs Weight loss to date: 14 lbs Requesting obesity pharmacotherapy: yes Current weight loss supplements/medications: yes Previous weight loss supplements/meds: no Calories:  2000  ANXIETY/STRESS Taking Wellbutrin XL 300 MG daily and Klonopin as needed.  Pt is aware of risks of benzo medication use to include increased sedation, respiratory suppression, falls, dependence and  cardiovascular events.  Pt would like to continue treatment as benefit determined to outweigh risk. PDMP 03/10/22 last refill.  Is using CPAP for sleep, last visit with sleep team 01/27/23.  Does endorse good sleep pattern with this. Duration:stable Anxious mood: no  Excessive worrying: no Irritability: no  Sweating: no Nausea: no Palpitations:no Hyperventilation: no Panic attacks: no Agoraphobia: no  Obscessions/compulsions: no Depressed mood: no    03/23/2023    1:19 PM 09/26/2022    2:48 PM 07/29/2022    2:44 PM 03/10/2022    8:52 AM 02/27/2022    1:35 PM  Depression screen PHQ 2/9  Decreased Interest 0 0 0 0 0  Down, Depressed, Hopeless 0 1 1 0 0  PHQ - 2 Score 0 1 1 0 0  Altered sleeping 0 2 1 1 1   Tired, decreased energy 1 1 1 1 1   Change in appetite 0 2 1 1 1   Feeling bad or failure about yourself  0 0 1 0 0  Trouble concentrating 1 1 1 1 1   Moving slowly or fidgety/restless 0 0 1 0 0  Suicidal thoughts 0 0 0 0 0  PHQ-9 Score 2 7 7 4 4   Difficult doing work/chores Not difficult at all Somewhat difficult Somewhat difficult Somewhat difficult Somewhat difficult  Anhedonia: no Weight changes: no Insomnia: occasional Hypersomnia: no Fatigue/loss of energy: a little Feelings of worthlessness: no Feelings of guilt: no Impaired concentration/indecisiveness: no Suicidal ideations: no  Crying spells: no Recent Stressors/Life Changes: no   Relationship problems: no   Family stress: no     Financial stress:  no    Job stress: no   Recent death/loss: no     2023-03-26    1:20 PM 09/26/2022    2:48 PM 07/29/2022    2:44 PM 03/10/2022    8:54 AM  GAD 7 : Generalized Anxiety Score  Nervous, Anxious, on Edge 0 1 1 1   Control/stop worrying 0 0 0 0  Worry too much - different things 0 0 1 1  Trouble relaxing 0 1 1 1   Restless 0 1 1 1   Easily annoyed or irritable 0 1 1 1   Afraid - awful might happen 0 0 0 0  Total GAD 7 Score 0 4 5 5   Anxiety Difficulty Not difficult at all  Somewhat difficult Somewhat difficult Somewhat difficult      02/27/2022    1:35 PM 07/29/2022    2:44 PM 09/26/2022    2:48 PM 03-26-2023    1:18 PM 03/30/2023    1:30 PM  Fall Risk  Falls in the past year? 1 0 0 0 0  Was there an injury with Fall? 1 0 0 0 0  Fall Risk Category Calculator 3 0 0 0 0  Patient at Risk for Falls Due to History of fall(s) No Fall Risks No Fall Risks No Fall Risks No Fall Risks  Fall risk Follow up  Falls evaluation completed Falls evaluation completed Falls evaluation completed Falls evaluation completed    Functional Status Survey:     Past Medical History:  Past Medical History:  Diagnosis Date   Anxiety    Family history of breast cancer    Family history of thyroid cancer    Kidney stones    Migraine    Oral herpes    Pyelonephritis    Surgical History:  Past Surgical History:  Procedure Laterality Date   NO PAST SURGERIES      Medications:  Current Outpatient Medications on File Prior to Visit  Medication Sig   albuterol (VENTOLIN HFA) 108 (90 Base) MCG/ACT inhaler Inhale 2 puffs into the lungs every 6 (six) hours as needed for wheezing or shortness of breath.   buPROPion (WELLBUTRIN XL) 300 MG 24 hr tablet Take 1 tablet (300 mg total) by mouth daily.   Cholecalciferol (VITAMIN D) 50 MCG (2000 UT) tablet Take 2,000 Units by mouth daily.   clonazePAM (KLONOPIN) 0.5 MG tablet Take 0.5 tablets (0.25 mg total) by mouth daily as needed for anxiety.   levonorgestrel (MIRENA) 20 MCG/24HR IUD 1 each by Intrauterine route once.   Probiotic Product (PROBIOTIC DAILY PO) Take by mouth 2 (two) times daily.   Semaglutide-Weight Management 0.5 MG/0.5ML SOAJ Inject 0.5 mg into the skin once a week.   No current facility-administered medications on file prior to visit.   Allergies:  No Known Allergies  Social History:  Social History   Socioeconomic History   Marital status: Married    Spouse name: Not on file   Number of children: Not on file    Years of education: Not on file   Highest education level: GED or equivalent  Occupational History   Not on file  Tobacco Use   Smoking status: Former    Current packs/day: 0.00    Average packs/day: 0.3 packs/day for 25.0 years (6.3 ttl pk-yrs)    Types: Cigarettes    Start date: 03/23/1994    Quit date: 03/24/2019    Years since quitting: 4.0   Smokeless tobacco: Never   Tobacco comments:    smoked off and  on since the age of 76    Pt states vapes daily   Vaping Use   Vaping status: Every Day   Substances: Nicotine  Substance and Sexual Activity   Alcohol use: Yes    Comment: occ   Drug use: No   Sexual activity: Yes    Birth control/protection: I.U.D.  Other Topics Concern   Not on file  Social History Narrative   Not on file   Social Drivers of Health   Financial Resource Strain: Low Risk  (03/23/2023)   Overall Financial Resource Strain (CARDIA)    Difficulty of Paying Living Expenses: Not very hard  Food Insecurity: No Food Insecurity (03/23/2023)   Hunger Vital Sign    Worried About Running Out of Food in the Last Year: Never true    Ran Out of Food in the Last Year: Never true  Transportation Needs: No Transportation Needs (03/23/2023)   PRAPARE - Administrator, Civil Service (Medical): No    Lack of Transportation (Non-Medical): No  Physical Activity: Insufficiently Active (03/23/2023)   Exercise Vital Sign    Days of Exercise per Week: 4 days    Minutes of Exercise per Session: 20 min  Stress: No Stress Concern Present (03/23/2023)   [employer] of Occupational Health - Occupational Stress Questionnaire    Feeling of Stress : Only a little  Social Connections: Moderately Isolated (03/23/2023)   Social Connection and Isolation Panel [NHANES]    Frequency of Communication with Friends and Family: Once a week    Frequency of Social Gatherings with Friends and Family: Once a week    Attends Religious Services: More than 4 times per year    Active  Member of [employer] or Organizations: No    Attends Engineer, structural: Not on file    Marital Status: Married  Catering manager Violence: Not on file   Social History   Tobacco Use  Smoking Status Former   Current packs/day: 0.00   Average packs/day: 0.3 packs/day for 25.0 years (6.3 ttl pk-yrs)   Types: Cigarettes   Start date: 03/23/1994   Quit date: 03/24/2019   Years since quitting: 4.0  Smokeless Tobacco Never  Tobacco Comments   smoked off and on since the age of 26   Pt states vapes daily    Social History   Substance and Sexual Activity  Alcohol Use Yes   Comment: occ    Family History:  Family History  Problem Relation Age of Onset   Alcohol abuse Mother    Mental illness Mother    Thyroid disease Mother    Clotting disorder Father        Factor II Prothrombin Gene Mutation   Cancer Maternal Aunt        Thyroid gland   Breast cancer Maternal Aunt    Clotting disorder Paternal Uncle        Factor II Prothrombin Gene Mutation   Cancer Maternal Grandmother 46       breast   Diabetes Maternal Grandmother    Heart disease Maternal Grandmother        Multiple MIs and CABG   Hip fracture Maternal Grandmother    Breast cancer Maternal Grandmother    Hypertension Maternal Grandfather    Colon polyps Maternal Grandfather 48   Diabetes Paternal Grandmother    Sleep apnea Neg Hx     Past medical history, surgical history, medications, allergies, family history and social history reviewed with patient today and  changes made to appropriate areas of the chart.   ROS All other ROS negative except what is listed above and in the HPI.      Objective:    BP 116/77 (BP Location: Left Arm, Patient Position: Sitting, Cuff Size: Normal)   Pulse 66   Temp 98.1 F (36.7 C) (Oral)   Ht 5\' 3"  (1.6 m)   Wt 189 lb 9.6 oz (86 kg)   LMP 03/26/2023 (Exact Date)   SpO2 100%   BMI 33.59 kg/m   Wt Readings from Last 3 Encounters:  03/30/23 189 lb 9.6 oz (86 kg)   03/23/23 190 lb 12.8 oz (86.5 kg)  09/26/22 202 lb 3.2 oz (91.7 kg)    Physical Exam Vitals and nursing note reviewed. Exam conducted with a chaperone present.  Constitutional:      General: She is awake. She is not in acute distress.    Appearance: She is well-developed and well-groomed. She is obese. She is not ill-appearing or toxic-appearing.  HENT:     Head: Normocephalic and atraumatic.     Right Ear: Hearing, tympanic membrane, ear canal and external ear normal. No drainage.     Left Ear: Hearing, tympanic membrane, ear canal and external ear normal. No drainage.     Nose: Nose normal.     Right Sinus: No maxillary sinus tenderness or frontal sinus tenderness.     Left Sinus: No maxillary sinus tenderness or frontal sinus tenderness.     Mouth/Throat:     Mouth: Mucous membranes are moist.     Pharynx: Oropharynx is clear. Uvula midline. No pharyngeal swelling, oropharyngeal exudate or posterior oropharyngeal erythema.  Eyes:     General: Lids are normal.        Right eye: No discharge.        Left eye: No discharge.     Extraocular Movements: Extraocular movements intact.     Conjunctiva/sclera: Conjunctivae normal.     Pupils: Pupils are equal, round, and reactive to light.     Visual Fields: Right eye visual fields normal and left eye visual fields normal.  Neck:     Thyroid: No thyromegaly.     Vascular: No carotid bruit.     Trachea: Trachea normal.  Cardiovascular:     Rate and Rhythm: Normal rate and regular rhythm.     Heart sounds: Normal heart sounds. No murmur heard.    No gallop.  Pulmonary:     Effort: Pulmonary effort is normal. No accessory muscle usage or respiratory distress.     Breath sounds: Normal breath sounds.  Chest:  Breasts:    Right: Normal.     Left: Normal.  Abdominal:     General: Bowel sounds are normal.     Palpations: Abdomen is soft. There is no hepatomegaly or splenomegaly.     Tenderness: There is no abdominal tenderness.   Musculoskeletal:        General: Normal range of motion.     Cervical back: Normal range of motion and neck supple.     Right lower leg: No edema.     Left lower leg: No edema.  Lymphadenopathy:     Head:     Right side of head: No submental, submandibular, tonsillar, preauricular or posterior auricular adenopathy.     Left side of head: No submental, submandibular, tonsillar, preauricular or posterior auricular adenopathy.     Cervical: No cervical adenopathy.     Upper Body:     Right  upper body: No supraclavicular, axillary or pectoral adenopathy.     Left upper body: No supraclavicular, axillary or pectoral adenopathy.  Skin:    General: Skin is warm and dry.     Capillary Refill: Capillary refill takes less than 2 seconds.     Findings: No rash.  Neurological:     Mental Status: She is alert and oriented to person, place, and time.     Gait: Gait is intact.     Deep Tendon Reflexes: Reflexes are normal and symmetric.     Reflex Scores:      Brachioradialis reflexes are 2+ on the right side and 2+ on the left side.      Patellar reflexes are 2+ on the right side and 2+ on the left side. Psychiatric:        Attention and Perception: Attention normal.        Mood and Affect: Mood normal.        Speech: Speech normal.        Behavior: Behavior normal. Behavior is cooperative.        Thought Content: Thought content normal.        Judgment: Judgment normal.    Results for orders placed or performed in visit on 03/10/22  [id number] 11+Oxyco+Alc+Crt-Bund   Collection Time: 03/10/22  9:23 AM  Result Value Ref Range   Ethanol Negative Cutoff=0.020 %   Amphetamines, Urine Negative Cutoff=1000 ng/mL   Barbiturate Negative Cutoff=200 ng/mL   BENZODIAZ UR QL Negative Cutoff=200 ng/mL   Cannabinoid Quant, Ur See Final Results Cutoff=50 ng/mL   Cocaine (Metabolite) Negative Cutoff=300 ng/mL   OPIATE SCREEN URINE Negative Cutoff=300 ng/mL   Oxycodone/Oxymorphone, Urine Negative  Cutoff=300 ng/mL   Phencyclidine Negative Cutoff=25 ng/mL   Methadone Screen, Urine Negative Cutoff=300 ng/mL   Propoxyphene Negative Cutoff=300 ng/mL   Meperidine Negative Cutoff=200 ng/mL   Tramadol Negative Cutoff=200 ng/mL   Creatinine 32.1 20.0 - 300.0 mg/dL   pH, Urine 6.8 4.5 - 8.9  CBC with Differential/Platelet   Collection Time: 03/10/22  9:23 AM  Result Value Ref Range   WBC 7.0 3.4 - 10.8 x10E3/uL   RBC 4.56 3.77 - 5.28 x10E6/uL   Hemoglobin 13.5 11.1 - 15.9 g/dL   Hematocrit 14.7 82.9 - 46.6 %   MCV 91 79 - 97 fL   MCH 29.6 26.6 - 33.0 pg   MCHC 32.5 31.5 - 35.7 g/dL   RDW 56.2 (L) 13.0 - 86.5 %   Platelets 414 150 - 450 x10E3/uL   Neutrophils 67 Not Estab. %   Lymphs 20 Not Estab. %   Monocytes 8 Not Estab. %   Eos 4 Not Estab. %   Basos 1 Not Estab. %   Neutrophils Absolute 4.7 1.4 - 7.0 x10E3/uL   Lymphocytes Absolute 1.4 0.7 - 3.1 x10E3/uL   Monocytes Absolute 0.6 0.1 - 0.9 x10E3/uL   EOS (ABSOLUTE) 0.3 0.0 - 0.4 x10E3/uL   Basophils Absolute 0.0 0.0 - 0.2 x10E3/uL   Immature Granulocytes 0 Not Estab. %   Immature Grans (Abs) 0.0 0.0 - 0.1 x10E3/uL  Comprehensive metabolic panel   Collection Time: 03/10/22  9:23 AM  Result Value Ref Range   Glucose 99 70 - 99 mg/dL   BUN 10 6 - 24 mg/dL   Creatinine, Ser 7.84 0.57 - 1.00 mg/dL   eGFR 696 >29 BM/WUX/3.24   BUN/Creatinine Ratio 13 9 - 23   Sodium 138 134 - 144 mmol/L   Potassium 4.6 3.5 - 5.2  mmol/L   Chloride 105 96 - 106 mmol/L   CO2 22 20 - 29 mmol/L   Calcium 9.5 8.7 - 10.2 mg/dL   Total Protein 6.9 6.0 - 8.5 g/dL   Albumin 4.4 3.9 - 4.9 g/dL   Globulin, Total 2.5 1.5 - 4.5 g/dL   Albumin/Globulin Ratio 1.8 1.2 - 2.2   Bilirubin Total 0.2 0.0 - 1.2 mg/dL   Alkaline Phosphatase 92 44 - 121 IU/L   AST 19 0 - 40 IU/L   ALT 27 0 - 32 IU/L  TSH   Collection Time: 03/10/22  9:23 AM  Result Value Ref Range   TSH 1.090 0.450 - 4.500 uIU/mL  Lipid Panel w/o Chol/HDL Ratio   Collection Time: 03/10/22   9:23 AM  Result Value Ref Range   Cholesterol, Total 142 100 - 199 mg/dL   Triglycerides 48 0 - 149 mg/dL   HDL 59 >09 mg/dL   VLDL Cholesterol Cal 11 5 - 40 mg/dL   LDL Chol Calc (NIH) 72 0 - 99 mg/dL  VITAMIN D 25 Hydroxy (Vit-D Deficiency, Fractures)   Collection Time: 03/10/22  9:23 AM  Result Value Ref Range   Vit D, 25-Hydroxy 24.9 (L) 30.0 - 100.0 ng/mL  Cannabinoid Conf, Ur   Collection Time: 03/10/22  9:23 AM  Result Value Ref Range   CANNABINOIDS Positive (A) Cutoff=50   Carboxy THC GC/MS Conf 24 Cutoff=15 ng/mL      Assessment & Plan:   Problem List Items Addressed This Visit       Respiratory   OSA on CPAP   Diagnosed on 11/11/22.  Continue use of CPAP, recommend 100% use.      Relevant Orders   CBC with Differential/Platelet     Other   Anxiety - Primary   Chronic, stable.  Denies SI/HI. Will continue Wellbutrin 300 MG XL daily as is offering benefit.  Continue Klonopin only as needed, recommend using this VERY sparingly with goal to stop in future -- she minimally uses.  UDS due 03/30/23 and controlled substance contract up to date.        Relevant Orders   [id number] 11+Oxyco+Alc+Crt-Bund   Cough   Ongoing, some mild improvement.  Will add on Prednisone 40 MG daily for 5 days.  Discussed with her if ongoing after Prednisone complete then will send in Augmentin.  Continue at home Mucinex.  If any worsening or ongoing then would consider imaging.      Family history of thyroid cancer   Thyroid glands assessed today. No lumps or mass noted. TSH ordered today.       Relevant Orders   TSH   Obesity   BMI 33.59. Loss present, 14 lbs, with Semaglutide.  Will send in refills upon request to [facility] and monitor -- next dose will be 1 MG.  Recommended eating smaller high protein, low fat meals more frequently and exercising 30 mins a day 5 times a week with a goal of 10-15lb weight loss in the next 3 months. Patient voiced their understanding and motivation to adhere  to these recommendations. .      Surveillance of intrauterine contraceptive device   Will call to schedule pap and IUD check -- may need new IUD (refer to recent GYN note) or imaging.  Discussed with patient.      Vitamin D deficiency   Chronic, ongoing.  Recommend she continue supplement and we will adjust as needed.  Check level today.      Relevant  Orders   VITAMIN D 25 Hydroxy (Vit-D Deficiency, Fractures)   Other Visit Diagnoses       High risk medication use       UDS today for Klonopin.   Relevant Orders   [id number] 11+Oxyco+Alc+Crt-Bund     Encounter for lipid screening for cardiovascular disease       Lipid panel on labs today.   Relevant Orders   Comprehensive metabolic panel   Lipid Panel w/o Chol/HDL Ratio     Encounter for annual physical exam       Annual physical today with labs and health maintenance reviewed, discussed with patient.        Follow up plan: Return in about 1 year (around 03/29/2024) for Annual Physical + when ready schedule visit for IUD check and pap (may need new IUD).   LABORATORY TESTING:  - Pap smear: not today as is on cycle - has had IUD for 2 years  IMMUNIZATIONS:   - Tetanus vaccination status reviewed: last tetanus booster within 10 years. - Influenza: Refuses - Pneumovax: Not applicable - Prevnar: Not applicable - COVID: Up to date x 3 - HPV: Not applicable - Shingrix vaccine: Not applicable  SCREENING: -Mammogram: Up To Date = 06/19/22 - Colonoscopy: Not applicable  - Bone Density: Not applicable  -Hearing Test: Not applicable  -Spirometry: Not applicable   PATIENT COUNSELING:   Advised to take 1 mg of folate supplement per day if capable of pregnancy.   Sexuality: Discussed sexually transmitted diseases, partner selection, use of condoms, avoidance of unintended pregnancy  and contraceptive alternatives.   Advised to avoid cigarette smoking.  I discussed with the patient that most people either abstain from alcohol or  drink within safe limits (<=14/week and <=4 drinks/occasion for males, <=7/weeks and <= 3 drinks/occasion for females) and that the risk for alcohol disorders and other health effects rises proportionally with the number of drinks per week and how often a drinker exceeds daily limits.  Discussed cessation/primary prevention of drug use and availability of treatment for abuse.   Diet: Encouraged to adjust caloric intake to maintain  or achieve ideal body weight, to reduce intake of dietary saturated fat and total fat, to limit sodium intake by avoiding high sodium foods and not adding table salt, and to maintain adequate dietary potassium and calcium preferably from fresh fruits, vegetables, and low-fat dairy products.    Stressed the importance of regular exercise  Injury prevention: Discussed safety belts, safety helmets, smoke detector, smoking near bedding or upholstery.   Dental health: Discussed importance of regular tooth brushing, flossing, and dental visits.    NEXT PREVENTATIVE PHYSICAL DUE IN 1 YEAR. Return in about 1 year (around 03/29/2024) for Annual Physical + when ready schedule visit for IUD check and pap (may need new IUD).

## 2023-03-30 NOTE — Assessment & Plan Note (Signed)
Thyroid glands assessed today. No lumps or mass noted. TSH ordered today.  

## 2023-03-30 NOTE — Assessment & Plan Note (Addendum)
 BMI 33.59. Loss present, 14 lbs, with Semaglutide.  Will send in refills upon request to [facility] and monitor -- next dose will be 1 MG.  Recommended eating smaller high protein, low fat meals more frequently and exercising 30 mins a day 5 times a week with a goal of 10-15lb weight loss in the next 3 months. Patient voiced their understanding and motivation to adhere to these recommendations. Marland Kitchen

## 2023-03-30 NOTE — Assessment & Plan Note (Signed)
 Chronic, stable.  Denies SI/HI. Will continue Wellbutrin 300 MG XL daily as is offering benefit.  Continue Klonopin only as needed, recommend using this VERY sparingly with goal to stop in future -- she minimally uses.  UDS due 03/30/23 and controlled substance contract up to date.

## 2023-03-31 ENCOUNTER — Encounter: Payer: Self-pay | Admitting: Nurse Practitioner

## 2023-03-31 LAB — LIPID PANEL W/O CHOL/HDL RATIO
Cholesterol, Total: 132 mg/dL (ref 100–199)
HDL: 38 mg/dL — ABNORMAL LOW (ref >39)
LDL Chol Calc (NIH): 75 mg/dL (ref 0–99)
Triglycerides: 104 mg/dL (ref 0–149)
VLDL Cholesterol Cal: 19 mg/dL (ref 5–40)

## 2023-03-31 LAB — CBC WITH DIFFERENTIAL/PLATELET
Basophils Absolute: 0.1 10*3/uL (ref 0.0–0.2)
Basos: 1 %
EOS (ABSOLUTE): 0.2 10*3/uL (ref 0.0–0.4)
Eos: 2 %
Hematocrit: 41.3 % (ref 34.0–46.6)
Hemoglobin: 13.5 g/dL (ref 11.1–15.9)
Immature Grans (Abs): 0 10*3/uL (ref 0.0–0.1)
Immature Granulocytes: 0 %
Lymphocytes Absolute: 2.1 10*3/uL (ref 0.7–3.1)
Lymphs: 29 %
MCH: 29.9 pg (ref 26.6–33.0)
MCHC: 32.7 g/dL (ref 31.5–35.7)
MCV: 91 fL (ref 79–97)
Monocytes Absolute: 0.6 10*3/uL (ref 0.1–0.9)
Monocytes: 8 %
Neutrophils Absolute: 4.5 10*3/uL (ref 1.4–7.0)
Neutrophils: 60 %
Platelets: 378 10*3/uL (ref 150–450)
RBC: 4.52 x10E6/uL (ref 3.77–5.28)
RDW: 12.6 % (ref 11.7–15.4)
WBC: 7.5 10*3/uL (ref 3.4–10.8)

## 2023-03-31 LAB — COMPREHENSIVE METABOLIC PANEL
ALT: 40 IU/L — ABNORMAL HIGH (ref 0–32)
AST: 22 IU/L (ref 0–40)
Albumin: 4.5 g/dL (ref 3.9–4.9)
Alkaline Phosphatase: 90 IU/L (ref 44–121)
BUN/Creatinine Ratio: 9 (ref 9–23)
BUN: 7 mg/dL (ref 6–24)
Bilirubin Total: 0.3 mg/dL (ref 0.0–1.2)
CO2: 23 mmol/L (ref 20–29)
Calcium: 9.2 mg/dL (ref 8.7–10.2)
Chloride: 103 mmol/L (ref 96–106)
Creatinine, Ser: 0.78 mg/dL (ref 0.57–1.00)
Globulin, Total: 2.2 g/dL (ref 1.5–4.5)
Glucose: 67 mg/dL — ABNORMAL LOW (ref 70–99)
Potassium: 4.1 mmol/L (ref 3.5–5.2)
Sodium: 138 mmol/L (ref 134–144)
Total Protein: 6.7 g/dL (ref 6.0–8.5)
eGFR: 97 mL/min/{1.73_m2} (ref >59)

## 2023-03-31 LAB — TSH: TSH: 1.16 u[IU]/mL (ref 0.450–4.500)

## 2023-03-31 LAB — VITAMIN D 25 HYDROXY (VIT D DEFICIENCY, FRACTURES): Vit D, 25-Hydroxy: 27.8 ng/mL — ABNORMAL LOW (ref 30.0–100.0)

## 2023-03-31 NOTE — Progress Notes (Signed)
 Contacted via MyChart   Good morning Marquerite, just waiting on CBC and if abnormal will let you know: - Kidney function, creatinine and eGFR, remains normal.  Liver function shows very mild elevation in ALT, but normal AST. We will continue to monitor these. - Lipid panel shows stable levels. - Vitamin D remains a little on low side, ensure to take Vitamin D3 2000 units daily for bone health. - Thyroid normal.  Any questions? Keep being stellar!!  Thank you for allowing me to participate in your care.  I appreciate you. Kindest regards, Roseanne Juenger

## 2023-04-01 LAB — DRUG SCREEN 764883 11+OXYCO+ALC+CRT-BUND
Amphetamines, Urine: NEGATIVE ng/mL
BENZODIAZ UR QL: NEGATIVE ng/mL
Barbiturate: NEGATIVE ng/mL
Cannabinoid Quant, Ur: NEGATIVE ng/mL
Cocaine (Metabolite): NEGATIVE ng/mL
Creatinine: 28.3 mg/dL (ref 20.0–300.0)
Ethanol: NEGATIVE %
Meperidine: NEGATIVE ng/mL
Methadone Screen, Urine: NEGATIVE ng/mL
OPIATE SCREEN URINE: NEGATIVE ng/mL
Oxycodone/Oxymorphone, Urine: NEGATIVE ng/mL
Phencyclidine: NEGATIVE ng/mL
Propoxyphene: NEGATIVE ng/mL
Tramadol: NEGATIVE ng/mL
pH, Urine: 5.5 (ref 4.5–8.9)

## 2023-04-03 MED ORDER — SEMAGLUTIDE-WEIGHT MANAGEMENT 1 MG/0.5ML ~~LOC~~ SOAJ: 1.0000 mg | SUBCUTANEOUS | 2 refills | Status: DC

## 2023-04-03 MED ORDER — AMOXICILLIN-POT CLAVULANATE 875-125 MG PO TABS: 1.0000 | ORAL_TABLET | Freq: Two times a day (BID) | ORAL | 0 refills | Status: AC

## 2023-04-03 NOTE — Addendum Note (Signed)
 Addended by: Aura Dials T on: 04/03/2023 12:46 PM   Modules accepted: Orders

## 2023-04-05 NOTE — Patient Instructions (Signed)
Pap Test Why am I having this test? A Pap test, also called a Pap smear, is a screening test to check for signs of: Infection. Cancer of the cervix. The cervix is the lower part of the uterus that opens into the vagina. Changes that may be a sign that cancer is developing (precancerous changes). Women need this test on a regular basis. In general, you should have a Pap test every 3 years until you reach menopause or age 44. Women aged 30-60 may choose to have their Pap test done at the same time as an HPV (human papillomavirus) test every 5 years (instead of every 3 years). Your health care provider may recommend having Pap tests more or less often depending on your medical conditions and past Pap test results. What is being tested? Cervical cells are tested for signs of infection or abnormalities. What kind of sample is taken?  Your health care provider will collect a sample of cells from the surface of your cervix. This will be done using a small cotton swab, plastic spatula, or brush that is inserted into your vagina using a tool called a speculum. This sample is often collected during a pelvic exam, when you are lying on your back on an exam table with your feet in footrests (stirrups). In some cases, fluids (secretions) from the cervix or vagina may also be collected. How do I prepare for this test? Be aware of where you are in your menstrual cycle. If you are menstruating on the day of the test, you may be asked to reschedule. You may need to reschedule if you have a known vaginal infection on the day of the test. Follow instructions from your health care provider about: Changing or stopping your regular medicines. Some medicines can cause abnormal test results, such as vaginal medicines and tetracycline. Avoiding douching 2-3 days before or the day of the test. Tell a health care provider about: Any allergies you have. All medicines you are taking, including vitamins, herbs, eye drops,  creams, and over-the-counter medicines. Any bleeding problems you have. Any surgeries you have had. Any medical conditions you have. Whether you are pregnant or may be pregnant. How are the results reported? Your test results will be reported as either abnormal or normal. What do the results mean? A normal test result means that you do not have signs of cancer of the cervix. An abnormal result may mean that you have: Cancer. A Pap test by itself is not enough to diagnose cancer. You will have more tests done if cancer is suspected. Precancerous changes in your cervix. Inflammation of the cervix. An STI (sexually transmitted infection). A fungal infection. A parasite infection. Talk with your health care provider about what your results mean. In some cases, your health care provider may do more testing to confirm the results. Questions to ask your health care provider Ask your health care provider, or the department that is doing the test: When will my results be ready? How will I get my results? What are my treatment options? What other tests do I need? What are my next steps? Summary In general, women should have a Pap test every 3 years until they reach menopause or age 44. Your health care provider will collect a sample of cells from the surface of your cervix. This will be done using a small cotton swab, plastic spatula, or brush. In some cases, fluids (secretions) from the cervix or vagina may also be collected. This information is not   intended to replace advice given to you by your health care provider. Make sure you discuss any questions you have with your health care provider. Document Revised: 04/13/2020 Document Reviewed: 04/13/2020 Elsevier Patient Education  2024 [employer] Inc[employer]  

## 2023-04-10 ENCOUNTER — Ambulatory Visit: Admitting: Nurse Practitioner

## 2023-04-10 ENCOUNTER — Other Ambulatory Visit (HOSPITAL_COMMUNITY)
Admission: RE | Admit: 2023-04-10 | Discharge: 2023-04-10 | Disposition: A | Discharge status: Home / Self Care | Source: Ambulatory Visit | Attending: Nurse Practitioner | Admitting: Nurse Practitioner

## 2023-04-10 ENCOUNTER — Encounter: Payer: Self-pay | Admitting: Nurse Practitioner

## 2023-04-10 VITALS — BP 99/66 | HR 69 | Temp 98.3°F | Ht 63.0 in | Wt 188.8 lb

## 2023-04-10 DIAGNOSIS — Z30431 Encounter for routine checking of intrauterine contraceptive device: Secondary | ICD-10-CM | POA: Insufficient documentation

## 2023-04-10 DIAGNOSIS — Z6835 Body mass index (BMI) 35.0-35.9, adult: Secondary | ICD-10-CM

## 2023-04-10 DIAGNOSIS — Z124 Encounter for screening for malignant neoplasm of cervix: Secondary | ICD-10-CM | POA: Insufficient documentation

## 2023-04-10 DIAGNOSIS — E66812 Obesity, class 2: Secondary | ICD-10-CM | POA: Diagnosis not present

## 2023-04-10 DIAGNOSIS — E6609 Other obesity due to excess calories: Secondary | ICD-10-CM | POA: Diagnosis not present

## 2023-04-10 MED ORDER — SEMAGLUTIDE-WEIGHT MANAGEMENT 1 MG/0.5ML ~~LOC~~ SOAJ: 1.0000 mg | SUBCUTANEOUS | 2 refills | Status: DC

## 2023-04-10 NOTE — Progress Notes (Signed)
 BP 99/66   Pulse 69   Temp 98.3 F (36.8 C) (Oral)   Ht 5\' 3"  (1.6 m)   Wt 188 lb 12.8 oz (85.6 kg)   LMP 03/26/2023 (Exact Date)   SpO2 98%   BMI 33.44 kg/m    Subjective:    Patient ID: Molly Lynch, female    DOB: 22-Feb-1980, 43 y.o.   MRN: [id number]  HPI: MARVELOUS WOOLFORD is a 43 y.o. female  Chief Complaint  Patient presents with   Gynecologic Exam   Presents today for pap exam + will check IUD.  Has been having heavier bleeding and had cycle 3 times in the past 3 months.  Prior to this had a cycle only on the full moon, but this has progressively gotten heavier and gotten worse.  Prior IUD she had not had a cycle for 5 years, but this one she has had cycles and heavy cycles. Current IUD placed October 2022.  Relevant past medical, surgical, family and social history reviewed and updated as indicated. Interim medical history since our last visit reviewed. Allergies and medications reviewed and updated.  Review of Systems  Constitutional:  Negative for activity change, appetite change, diaphoresis, fatigue and fever.  Respiratory:  Negative for cough, chest tightness and shortness of breath.   Cardiovascular:  Negative for chest pain, palpitations and leg swelling.  Gastrointestinal: Negative.   Neurological: Negative.   Psychiatric/Behavioral: Negative.     Per HPI unless specifically indicated above     Objective:    BP 99/66   Pulse 69   Temp 98.3 F (36.8 C) (Oral)   Ht 5\' 3"  (1.6 m)   Wt 188 lb 12.8 oz (85.6 kg)   LMP 03/26/2023 (Exact Date)   SpO2 98%   BMI 33.44 kg/m   Wt Readings from Last 3 Encounters:  04/10/23 188 lb 12.8 oz (85.6 kg)  03/30/23 189 lb 9.6 oz (86 kg)  03/23/23 190 lb 12.8 oz (86.5 kg)    Physical Exam Vitals and nursing note reviewed. Exam conducted with a chaperone present.  Constitutional:      General: She is awake. She is not in acute distress.    Appearance: She is well-developed and well-groomed. She is obese.  She is not ill-appearing or toxic-appearing.  HENT:     Head: Normocephalic.     Right Ear: Hearing and external ear normal.     Left Ear: Hearing and external ear normal.  Eyes:     General: Lids are normal.        Right eye: No discharge.        Left eye: No discharge.     Conjunctiva/sclera: Conjunctivae normal.     Pupils: Pupils are equal, round, and reactive to light.  Neck:     Thyroid: No thyromegaly.     Vascular: No carotid bruit.  Cardiovascular:     Rate and Rhythm: Normal rate and regular rhythm.     Heart sounds: Normal heart sounds. No murmur heard.    No gallop.  Pulmonary:     Effort: Pulmonary effort is normal. No accessory muscle usage or respiratory distress.     Breath sounds: Normal breath sounds.  Abdominal:     General: Bowel sounds are normal. There is no distension.     Palpations: Abdomen is soft.     Tenderness: There is no abdominal tenderness.     Hernia: There is no hernia in the left inguinal area or right inguinal  area.  Genitourinary:    Exam position: Lithotomy position.     Labia:        Right: No rash.        Left: No rash.      Urethra: No prolapse.     Vagina: Normal.     Cervix: Normal.     Uterus: Normal.      Adnexa: Right adnexa normal and left adnexa normal.     Comments: Cervix anterior and viewed.  IUD strings present x 2.   Musculoskeletal:     Cervical back: Normal range of motion and neck supple.     Right lower leg: No edema.     Left lower leg: No edema.  Lymphadenopathy:     Cervical: No cervical adenopathy.  Skin:    General: Skin is warm and dry.  Neurological:     Mental Status: She is alert and oriented to person, place, and time.     Deep Tendon Reflexes: Reflexes are normal and symmetric.     Reflex Scores:      Brachioradialis reflexes are 2+ on the right side and 2+ on the left side.      Patellar reflexes are 2+ on the right side and 2+ on the left side. Psychiatric:        Attention and Perception:  Attention normal.        Mood and Affect: Mood normal.        Speech: Speech normal.        Behavior: Behavior normal. Behavior is cooperative.        Thought Content: Thought content normal.    Results for orders placed or performed in visit on 03/30/23  [id number] 11+Oxyco+Alc+Crt-Bund   Collection Time: 03/30/23  1:30 PM  Result Value Ref Range   Ethanol Negative Cutoff=0.020 %   Amphetamines, Urine Negative Cutoff=1000 ng/mL   Barbiturate Negative Cutoff=200 ng/mL   BENZODIAZ UR QL Negative Cutoff=200 ng/mL   Cannabinoid Quant, Ur Negative Cutoff=50 ng/mL   Cocaine (Metabolite) Negative Cutoff=300 ng/mL   OPIATE SCREEN URINE Negative Cutoff=300 ng/mL   Oxycodone/Oxymorphone, Urine Negative Cutoff=300 ng/mL   Phencyclidine Negative Cutoff=25 ng/mL   Methadone Screen, Urine Negative Cutoff=300 ng/mL   Propoxyphene Negative Cutoff=300 ng/mL   Meperidine Negative Cutoff=200 ng/mL   Tramadol Negative Cutoff=200 ng/mL   Creatinine 28.3 20.0 - 300.0 mg/dL   pH, Urine 5.5 4.5 - 8.9  CBC with Differential/Platelet   Collection Time: 03/30/23  1:30 PM  Result Value Ref Range   WBC 7.5 3.4 - 10.8 x10E3/uL   RBC 4.52 3.77 - 5.28 x10E6/uL   Hemoglobin 13.5 11.1 - 15.9 g/dL   Hematocrit 30.8 65.7 - 46.6 %   MCV 91 79 - 97 fL   MCH 29.9 26.6 - 33.0 pg   MCHC 32.7 31.5 - 35.7 g/dL   RDW 84.6 96.2 - 95.2 %   Platelets 378 150 - 450 x10E3/uL   Neutrophils 60 Not Estab. %   Lymphs 29 Not Estab. %   Monocytes 8 Not Estab. %   Eos 2 Not Estab. %   Basos 1 Not Estab. %   Neutrophils Absolute 4.5 1.4 - 7.0 x10E3/uL   Lymphocytes Absolute 2.1 0.7 - 3.1 x10E3/uL   Monocytes Absolute 0.6 0.1 - 0.9 x10E3/uL   EOS (ABSOLUTE) 0.2 0.0 - 0.4 x10E3/uL   Basophils Absolute 0.1 0.0 - 0.2 x10E3/uL   Immature Granulocytes 0 Not Estab. %   Immature Grans (Abs) 0.0 0.0 - 0.1 x10E3/uL  Comprehensive metabolic panel   Collection Time: 03/30/23  1:30 PM  Result Value Ref Range   Glucose 67 (L) 70 - 99  mg/dL   BUN 7 6 - 24 mg/dL   Creatinine, Ser 1.61 0.57 - 1.00 mg/dL   eGFR 97 >09 UE/AVW/0.98   BUN/Creatinine Ratio 9 9 - 23   Sodium 138 134 - 144 mmol/L   Potassium 4.1 3.5 - 5.2 mmol/L   Chloride 103 96 - 106 mmol/L   CO2 23 20 - 29 mmol/L   Calcium 9.2 8.7 - 10.2 mg/dL   Total Protein 6.7 6.0 - 8.5 g/dL   Albumin 4.5 3.9 - 4.9 g/dL   Globulin, Total 2.2 1.5 - 4.5 g/dL   Bilirubin Total 0.3 0.0 - 1.2 mg/dL   Alkaline Phosphatase 90 44 - 121 IU/L   AST 22 0 - 40 IU/L   ALT 40 (H) 0 - 32 IU/L  TSH   Collection Time: 03/30/23  1:30 PM  Result Value Ref Range   TSH 1.160 0.450 - 4.500 uIU/mL  Lipid Panel w/o Chol/HDL Ratio   Collection Time: 03/30/23  1:30 PM  Result Value Ref Range   Cholesterol, Total 132 100 - 199 mg/dL   Triglycerides 119 0 - 149 mg/dL   HDL 38 (L) >14 mg/dL   VLDL Cholesterol Cal 19 5 - 40 mg/dL   LDL Chol Calc (NIH) 75 0 - 99 mg/dL  VITAMIN D 25 Hydroxy (Vit-D Deficiency, Fractures)   Collection Time: 03/30/23  1:30 PM  Result Value Ref Range   Vit D, 25-Hydroxy 27.8 (L) 30.0 - 100.0 ng/mL      Assessment & Plan:   Problem List Items Addressed This Visit       Other   Obesity - Primary   BMI 33.44. Loss present, 16 lbs, with Semaglutide thus far.  Send in 1 MG refills.  Recommended eating smaller high protein, low fat meals more frequently and exercising 30 mins a day 5 times a week with a goal of 10-15lb weight loss in the next 3 months. Patient voiced their understanding and motivation to adhere to these recommendations. .      Relevant Medications   Semaglutide-Weight Management 1 MG/0.5ML SOAJ   Other Visit Diagnoses       Cervical cancer screening       Pap obtained and sent to lab.  IUD strings present x 2.   Relevant Orders   Cytology - PAP        Follow up plan: Return for as scheduled March 2026.

## 2023-04-10 NOTE — Assessment & Plan Note (Addendum)
 BMI 33.44. Loss present, 16 lbs, with Semaglutide thus far.  Send in 1 MG refills.  Recommended eating smaller high protein, low fat meals more frequently and exercising 30 mins a day 5 times a week with a goal of 10-15lb weight loss in the next 3 months. Patient voiced their understanding and motivation to adhere to these recommendations. Marland Kitchen

## 2023-04-15 ENCOUNTER — Encounter: Payer: Self-pay | Admitting: Nurse Practitioner

## 2023-04-15 ENCOUNTER — Other Ambulatory Visit: Payer: Self-pay | Admitting: Nurse Practitioner

## 2023-04-15 LAB — CYTOLOGY - PAP
Comment: NEGATIVE
Diagnosis: NEGATIVE
High risk HPV: NEGATIVE

## 2023-04-15 MED ORDER — FLUCONAZOLE 150 MG PO TABS: 150.0000 mg | ORAL_TABLET | Freq: Every day | ORAL | 0 refills | Status: AC

## 2023-04-15 NOTE — Progress Notes (Signed)
 Contacted via MyChart   Good afternoon Molly Lynch, your pap returned and is all normal with exception of some yeast.  I am going to send in a dose of Diflucan for you.  Repeat pap in 5 years:)

## 2023-05-04 ENCOUNTER — Ambulatory Visit: Admitting: Nurse Practitioner

## 2023-05-04 ENCOUNTER — Encounter: Payer: Self-pay | Admitting: Nurse Practitioner

## 2023-05-04 MED ORDER — WEGOVY 1.7 MG/0.75ML ~~LOC~~ SOAJ: 1.7000 mg | SUBCUTANEOUS | 4 refills | Status: AC

## 2023-06-19 ENCOUNTER — Other Ambulatory Visit: Payer: Self-pay | Admitting: Nurse Practitioner

## 2023-06-23 NOTE — Telephone Encounter (Signed)
 Requested medications are due for refill today.  yes  Requested medications are on the active medications list.  yes  Last refill. 09/26/2022 #30 0 rf  Future visit scheduled.   yes  Notes to clinic.  Refill not delegated.    Requested Prescriptions  Pending Prescriptions Disp Refills   clonazePAM  (KLONOPIN ) 0.5 MG tablet [Pharmacy Med Name: CLONAZEPAM  0.5 MG TABLET] 30 tablet 0    Sig: TAKE 1/2 TABLET DAILY AS NEEDED FOR ANXIETY     Not Delegated - Psychiatry: Anxiolytics/Hypnotics 2 Failed - 06/23/2023 12:51 PM      Failed - This refill cannot be delegated      Passed - Urine Drug Screen completed in last 360 days      Passed - Patient is not pregnant      Passed - Valid encounter within last 6 months    Recent Outpatient Visits           2 months ago Class 2 obesity due to excess calories without serious comorbidity with body mass index (BMI) of 35.0 to 35.9 in adult   [place] [facility] [place], Lavelle Posey, NP   2 months ago Anxiety   [place] [facility] [place], [place] T, NP   3 months ago Upper respiratory tract infection, unspecified type   [place] [facility] Hadassah Letters, MD       Future Appointments             In 7 months Lomax, Amy, NP [facility]

## 2023-07-13 ENCOUNTER — Encounter: Payer: Self-pay | Admitting: Nurse Practitioner

## 2024-02-04 ENCOUNTER — Telehealth: Payer: 59 | Admitting: Family Medicine

## 2024-02-05 ENCOUNTER — Encounter: Payer: Self-pay | Admitting: Nurse Practitioner

## 2024-03-30 ENCOUNTER — Encounter: Admitting: Nurse Practitioner

## 2024-04-01 ENCOUNTER — Encounter: Admitting: Nurse Practitioner
